# Patient Record
Sex: Male | Born: 1950 | Race: Black or African American | Hispanic: No | Marital: Single | Smoking: Former smoker
Health system: Southern US, Community
[De-identification: ages and names within clinical notes are randomized; demographics above are authoritative.]

## PROBLEM LIST (undated history)

## (undated) DIAGNOSIS — N289 Disorder of kidney and ureter, unspecified: Secondary | ICD-10-CM

## (undated) DIAGNOSIS — E119 Type 2 diabetes mellitus without complications: Secondary | ICD-10-CM

## (undated) DIAGNOSIS — I10 Essential (primary) hypertension: Secondary | ICD-10-CM

## (undated) HISTORY — PX: KIDNEY TRANSPLANT: SHX239

---

## 2015-10-07 DIAGNOSIS — E118 Type 2 diabetes mellitus with unspecified complications: Secondary | ICD-10-CM | POA: Diagnosis not present

## 2015-10-07 DIAGNOSIS — I10 Essential (primary) hypertension: Secondary | ICD-10-CM | POA: Diagnosis not present

## 2015-10-07 DIAGNOSIS — Z113 Encounter for screening for infections with a predominantly sexual mode of transmission: Secondary | ICD-10-CM | POA: Diagnosis not present

## 2015-10-07 DIAGNOSIS — Z5181 Encounter for therapeutic drug level monitoring: Secondary | ICD-10-CM | POA: Diagnosis not present

## 2015-10-07 DIAGNOSIS — Z0000 Encounter for general adult medical examination without abnormal findings: Secondary | ICD-10-CM | POA: Diagnosis not present

## 2015-10-07 DIAGNOSIS — Z131 Encounter for screening for diabetes mellitus: Secondary | ICD-10-CM | POA: Diagnosis not present

## 2015-10-07 DIAGNOSIS — Z136 Encounter for screening for cardiovascular disorders: Secondary | ICD-10-CM | POA: Diagnosis not present

## 2015-10-07 DIAGNOSIS — Z1383 Encounter for screening for respiratory disorder NEC: Secondary | ICD-10-CM | POA: Diagnosis not present

## 2015-10-07 DIAGNOSIS — Z940 Kidney transplant status: Secondary | ICD-10-CM | POA: Diagnosis not present

## 2015-10-07 DIAGNOSIS — E559 Vitamin D deficiency, unspecified: Secondary | ICD-10-CM | POA: Diagnosis not present

## 2015-10-07 DIAGNOSIS — R05 Cough: Secondary | ICD-10-CM | POA: Diagnosis not present

## 2015-10-07 DIAGNOSIS — Z01118 Encounter for examination of ears and hearing with other abnormal findings: Secondary | ICD-10-CM | POA: Diagnosis not present

## 2015-10-07 DIAGNOSIS — E785 Hyperlipidemia, unspecified: Secondary | ICD-10-CM | POA: Diagnosis not present

## 2015-10-24 DIAGNOSIS — Z940 Kidney transplant status: Secondary | ICD-10-CM | POA: Diagnosis not present

## 2015-10-31 DIAGNOSIS — T383X1A Poisoning by insulin and oral hypoglycemic [antidiabetic] drugs, accidental (unintentional), initial encounter: Secondary | ICD-10-CM | POA: Diagnosis not present

## 2015-10-31 DIAGNOSIS — I10 Essential (primary) hypertension: Secondary | ICD-10-CM | POA: Diagnosis not present

## 2015-10-31 DIAGNOSIS — Z794 Long term (current) use of insulin: Secondary | ICD-10-CM | POA: Diagnosis not present

## 2015-10-31 DIAGNOSIS — E119 Type 2 diabetes mellitus without complications: Secondary | ICD-10-CM | POA: Diagnosis not present

## 2015-11-11 DIAGNOSIS — I10 Essential (primary) hypertension: Secondary | ICD-10-CM | POA: Diagnosis not present

## 2015-11-11 DIAGNOSIS — Z940 Kidney transplant status: Secondary | ICD-10-CM | POA: Diagnosis not present

## 2015-11-11 DIAGNOSIS — R55 Syncope and collapse: Secondary | ICD-10-CM | POA: Diagnosis not present

## 2015-11-14 DIAGNOSIS — Z940 Kidney transplant status: Secondary | ICD-10-CM | POA: Diagnosis not present

## 2015-11-16 DIAGNOSIS — H25011 Cortical age-related cataract, right eye: Secondary | ICD-10-CM | POA: Diagnosis not present

## 2015-11-16 DIAGNOSIS — H5213 Myopia, bilateral: Secondary | ICD-10-CM | POA: Diagnosis not present

## 2015-11-16 DIAGNOSIS — H2512 Age-related nuclear cataract, left eye: Secondary | ICD-10-CM | POA: Diagnosis not present

## 2015-11-16 DIAGNOSIS — E119 Type 2 diabetes mellitus without complications: Secondary | ICD-10-CM | POA: Diagnosis not present

## 2015-11-16 DIAGNOSIS — H2511 Age-related nuclear cataract, right eye: Secondary | ICD-10-CM | POA: Diagnosis not present

## 2015-11-16 DIAGNOSIS — H25012 Cortical age-related cataract, left eye: Secondary | ICD-10-CM | POA: Diagnosis not present

## 2015-11-28 DIAGNOSIS — Z940 Kidney transplant status: Secondary | ICD-10-CM | POA: Diagnosis not present

## 2015-11-28 DIAGNOSIS — Z0000 Encounter for general adult medical examination without abnormal findings: Secondary | ICD-10-CM | POA: Diagnosis not present

## 2015-11-28 DIAGNOSIS — E785 Hyperlipidemia, unspecified: Secondary | ICD-10-CM | POA: Diagnosis not present

## 2015-11-28 DIAGNOSIS — E118 Type 2 diabetes mellitus with unspecified complications: Secondary | ICD-10-CM | POA: Diagnosis not present

## 2015-11-28 DIAGNOSIS — Z0100 Encounter for examination of eyes and vision without abnormal findings: Secondary | ICD-10-CM | POA: Diagnosis not present

## 2015-11-28 DIAGNOSIS — N183 Chronic kidney disease, stage 3 (moderate): Secondary | ICD-10-CM | POA: Diagnosis not present

## 2015-11-28 DIAGNOSIS — B1920 Unspecified viral hepatitis C without hepatic coma: Secondary | ICD-10-CM | POA: Diagnosis not present

## 2015-11-28 DIAGNOSIS — E559 Vitamin D deficiency, unspecified: Secondary | ICD-10-CM | POA: Diagnosis not present

## 2015-11-28 DIAGNOSIS — I10 Essential (primary) hypertension: Secondary | ICD-10-CM | POA: Diagnosis not present

## 2016-01-24 DIAGNOSIS — Z940 Kidney transplant status: Secondary | ICD-10-CM | POA: Diagnosis not present

## 2016-01-25 DIAGNOSIS — Z940 Kidney transplant status: Secondary | ICD-10-CM | POA: Diagnosis not present

## 2016-03-03 DIAGNOSIS — R011 Cardiac murmur, unspecified: Secondary | ICD-10-CM | POA: Diagnosis not present

## 2016-03-03 DIAGNOSIS — E139 Other specified diabetes mellitus without complications: Secondary | ICD-10-CM | POA: Diagnosis not present

## 2016-03-03 DIAGNOSIS — I10 Essential (primary) hypertension: Secondary | ICD-10-CM | POA: Diagnosis not present

## 2016-03-07 DIAGNOSIS — I120 Hypertensive chronic kidney disease with stage 5 chronic kidney disease or end stage renal disease: Secondary | ICD-10-CM | POA: Diagnosis not present

## 2016-03-07 DIAGNOSIS — N186 End stage renal disease: Secondary | ICD-10-CM | POA: Diagnosis not present

## 2016-03-07 DIAGNOSIS — M25552 Pain in left hip: Secondary | ICD-10-CM | POA: Diagnosis not present

## 2016-03-07 DIAGNOSIS — E1122 Type 2 diabetes mellitus with diabetic chronic kidney disease: Secondary | ICD-10-CM | POA: Diagnosis not present

## 2016-03-07 DIAGNOSIS — Z794 Long term (current) use of insulin: Secondary | ICD-10-CM | POA: Diagnosis not present

## 2016-03-07 DIAGNOSIS — Z4822 Encounter for aftercare following kidney transplant: Secondary | ICD-10-CM | POA: Diagnosis not present

## 2016-03-07 DIAGNOSIS — D899 Disorder involving the immune mechanism, unspecified: Secondary | ICD-10-CM | POA: Diagnosis not present

## 2016-03-07 DIAGNOSIS — Z940 Kidney transplant status: Secondary | ICD-10-CM | POA: Diagnosis not present

## 2016-03-07 DIAGNOSIS — E785 Hyperlipidemia, unspecified: Secondary | ICD-10-CM | POA: Diagnosis not present

## 2016-03-07 DIAGNOSIS — I10 Essential (primary) hypertension: Secondary | ICD-10-CM | POA: Diagnosis not present

## 2016-03-07 DIAGNOSIS — E119 Type 2 diabetes mellitus without complications: Secondary | ICD-10-CM | POA: Diagnosis not present

## 2016-03-08 DIAGNOSIS — Z4822 Encounter for aftercare following kidney transplant: Secondary | ICD-10-CM | POA: Diagnosis not present

## 2016-03-08 DIAGNOSIS — Z940 Kidney transplant status: Secondary | ICD-10-CM | POA: Diagnosis not present

## 2016-03-19 DIAGNOSIS — B1920 Unspecified viral hepatitis C without hepatic coma: Secondary | ICD-10-CM | POA: Diagnosis not present

## 2016-03-19 DIAGNOSIS — E559 Vitamin D deficiency, unspecified: Secondary | ICD-10-CM | POA: Diagnosis not present

## 2016-03-19 DIAGNOSIS — E118 Type 2 diabetes mellitus with unspecified complications: Secondary | ICD-10-CM | POA: Diagnosis not present

## 2016-03-19 DIAGNOSIS — E785 Hyperlipidemia, unspecified: Secondary | ICD-10-CM | POA: Diagnosis not present

## 2016-03-19 DIAGNOSIS — Z940 Kidney transplant status: Secondary | ICD-10-CM | POA: Diagnosis not present

## 2016-03-19 DIAGNOSIS — N183 Chronic kidney disease, stage 3 (moderate): Secondary | ICD-10-CM | POA: Diagnosis not present

## 2016-03-19 DIAGNOSIS — I10 Essential (primary) hypertension: Secondary | ICD-10-CM | POA: Diagnosis not present

## 2016-04-13 DIAGNOSIS — Z940 Kidney transplant status: Secondary | ICD-10-CM | POA: Diagnosis not present

## 2016-04-16 DIAGNOSIS — Z940 Kidney transplant status: Secondary | ICD-10-CM | POA: Diagnosis not present

## 2016-04-16 DIAGNOSIS — E785 Hyperlipidemia, unspecified: Secondary | ICD-10-CM | POA: Diagnosis not present

## 2016-04-16 DIAGNOSIS — N183 Chronic kidney disease, stage 3 (moderate): Secondary | ICD-10-CM | POA: Diagnosis not present

## 2016-04-16 DIAGNOSIS — B1920 Unspecified viral hepatitis C without hepatic coma: Secondary | ICD-10-CM | POA: Diagnosis not present

## 2016-04-16 DIAGNOSIS — E559 Vitamin D deficiency, unspecified: Secondary | ICD-10-CM | POA: Diagnosis not present

## 2016-04-16 DIAGNOSIS — E118 Type 2 diabetes mellitus with unspecified complications: Secondary | ICD-10-CM | POA: Diagnosis not present

## 2016-04-16 DIAGNOSIS — I10 Essential (primary) hypertension: Secondary | ICD-10-CM | POA: Diagnosis not present

## 2016-04-18 DIAGNOSIS — N390 Urinary tract infection, site not specified: Secondary | ICD-10-CM | POA: Diagnosis not present

## 2016-04-18 DIAGNOSIS — Z940 Kidney transplant status: Secondary | ICD-10-CM | POA: Diagnosis not present

## 2016-04-20 DIAGNOSIS — Z940 Kidney transplant status: Secondary | ICD-10-CM | POA: Diagnosis not present

## 2016-05-14 DIAGNOSIS — Z940 Kidney transplant status: Secondary | ICD-10-CM | POA: Diagnosis not present

## 2016-07-02 DIAGNOSIS — E785 Hyperlipidemia, unspecified: Secondary | ICD-10-CM | POA: Diagnosis not present

## 2016-07-02 DIAGNOSIS — E118 Type 2 diabetes mellitus with unspecified complications: Secondary | ICD-10-CM | POA: Diagnosis not present

## 2016-07-02 DIAGNOSIS — Z940 Kidney transplant status: Secondary | ICD-10-CM | POA: Diagnosis not present

## 2016-07-02 DIAGNOSIS — I10 Essential (primary) hypertension: Secondary | ICD-10-CM | POA: Diagnosis not present

## 2016-07-02 DIAGNOSIS — Z136 Encounter for screening for cardiovascular disorders: Secondary | ICD-10-CM | POA: Diagnosis not present

## 2016-07-02 DIAGNOSIS — Z5181 Encounter for therapeutic drug level monitoring: Secondary | ICD-10-CM | POA: Diagnosis not present

## 2016-07-02 DIAGNOSIS — Z0000 Encounter for general adult medical examination without abnormal findings: Secondary | ICD-10-CM | POA: Diagnosis not present

## 2016-07-02 DIAGNOSIS — E559 Vitamin D deficiency, unspecified: Secondary | ICD-10-CM | POA: Diagnosis not present

## 2016-07-02 DIAGNOSIS — Z01118 Encounter for examination of ears and hearing with other abnormal findings: Secondary | ICD-10-CM | POA: Diagnosis not present

## 2016-07-17 DIAGNOSIS — Z940 Kidney transplant status: Secondary | ICD-10-CM | POA: Diagnosis not present

## 2016-07-17 DIAGNOSIS — N390 Urinary tract infection, site not specified: Secondary | ICD-10-CM | POA: Diagnosis not present

## 2016-10-08 DIAGNOSIS — E785 Hyperlipidemia, unspecified: Secondary | ICD-10-CM | POA: Diagnosis not present

## 2016-10-08 DIAGNOSIS — I10 Essential (primary) hypertension: Secondary | ICD-10-CM | POA: Diagnosis not present

## 2016-10-08 DIAGNOSIS — B1920 Unspecified viral hepatitis C without hepatic coma: Secondary | ICD-10-CM | POA: Diagnosis not present

## 2016-10-08 DIAGNOSIS — Z940 Kidney transplant status: Secondary | ICD-10-CM | POA: Diagnosis not present

## 2016-10-08 DIAGNOSIS — E559 Vitamin D deficiency, unspecified: Secondary | ICD-10-CM | POA: Diagnosis not present

## 2016-10-08 DIAGNOSIS — E118 Type 2 diabetes mellitus with unspecified complications: Secondary | ICD-10-CM | POA: Diagnosis not present

## 2016-10-08 DIAGNOSIS — N183 Chronic kidney disease, stage 3 (moderate): Secondary | ICD-10-CM | POA: Diagnosis not present

## 2016-10-08 DIAGNOSIS — Z0000 Encounter for general adult medical examination without abnormal findings: Secondary | ICD-10-CM | POA: Diagnosis not present

## 2016-10-30 DIAGNOSIS — E559 Vitamin D deficiency, unspecified: Secondary | ICD-10-CM | POA: Diagnosis not present

## 2016-10-30 DIAGNOSIS — Z940 Kidney transplant status: Secondary | ICD-10-CM | POA: Diagnosis not present

## 2016-11-01 DIAGNOSIS — N390 Urinary tract infection, site not specified: Secondary | ICD-10-CM | POA: Diagnosis not present

## 2016-11-01 DIAGNOSIS — Z940 Kidney transplant status: Secondary | ICD-10-CM | POA: Diagnosis not present

## 2016-12-04 DIAGNOSIS — Z940 Kidney transplant status: Secondary | ICD-10-CM | POA: Diagnosis not present

## 2016-12-04 DIAGNOSIS — N390 Urinary tract infection, site not specified: Secondary | ICD-10-CM | POA: Diagnosis not present

## 2016-12-13 DIAGNOSIS — H5213 Myopia, bilateral: Secondary | ICD-10-CM | POA: Diagnosis not present

## 2016-12-13 DIAGNOSIS — E113291 Type 2 diabetes mellitus with mild nonproliferative diabetic retinopathy without macular edema, right eye: Secondary | ICD-10-CM | POA: Diagnosis not present

## 2016-12-13 DIAGNOSIS — Z794 Long term (current) use of insulin: Secondary | ICD-10-CM | POA: Diagnosis not present

## 2016-12-13 DIAGNOSIS — H25013 Cortical age-related cataract, bilateral: Secondary | ICD-10-CM | POA: Diagnosis not present

## 2016-12-13 DIAGNOSIS — H2513 Age-related nuclear cataract, bilateral: Secondary | ICD-10-CM | POA: Diagnosis not present

## 2016-12-13 DIAGNOSIS — H524 Presbyopia: Secondary | ICD-10-CM | POA: Diagnosis not present

## 2017-01-21 DIAGNOSIS — Z940 Kidney transplant status: Secondary | ICD-10-CM | POA: Diagnosis not present

## 2017-01-21 DIAGNOSIS — E559 Vitamin D deficiency, unspecified: Secondary | ICD-10-CM | POA: Diagnosis not present

## 2017-01-21 DIAGNOSIS — N183 Chronic kidney disease, stage 3 (moderate): Secondary | ICD-10-CM | POA: Diagnosis not present

## 2017-01-21 DIAGNOSIS — I10 Essential (primary) hypertension: Secondary | ICD-10-CM | POA: Diagnosis not present

## 2017-01-21 DIAGNOSIS — B1920 Unspecified viral hepatitis C without hepatic coma: Secondary | ICD-10-CM | POA: Diagnosis not present

## 2017-01-21 DIAGNOSIS — E785 Hyperlipidemia, unspecified: Secondary | ICD-10-CM | POA: Diagnosis not present

## 2017-01-21 DIAGNOSIS — E118 Type 2 diabetes mellitus with unspecified complications: Secondary | ICD-10-CM | POA: Diagnosis not present

## 2017-03-01 DIAGNOSIS — Z940 Kidney transplant status: Secondary | ICD-10-CM | POA: Diagnosis not present

## 2017-03-04 DIAGNOSIS — E118 Type 2 diabetes mellitus with unspecified complications: Secondary | ICD-10-CM | POA: Diagnosis not present

## 2017-03-04 DIAGNOSIS — B1920 Unspecified viral hepatitis C without hepatic coma: Secondary | ICD-10-CM | POA: Diagnosis not present

## 2017-03-04 DIAGNOSIS — E785 Hyperlipidemia, unspecified: Secondary | ICD-10-CM | POA: Diagnosis not present

## 2017-03-04 DIAGNOSIS — Z940 Kidney transplant status: Secondary | ICD-10-CM | POA: Diagnosis not present

## 2017-03-04 DIAGNOSIS — E559 Vitamin D deficiency, unspecified: Secondary | ICD-10-CM | POA: Diagnosis not present

## 2017-03-04 DIAGNOSIS — I10 Essential (primary) hypertension: Secondary | ICD-10-CM | POA: Diagnosis not present

## 2017-03-04 DIAGNOSIS — N183 Chronic kidney disease, stage 3 (moderate): Secondary | ICD-10-CM | POA: Diagnosis not present

## 2017-03-06 DIAGNOSIS — N390 Urinary tract infection, site not specified: Secondary | ICD-10-CM | POA: Diagnosis not present

## 2017-03-06 DIAGNOSIS — Z940 Kidney transplant status: Secondary | ICD-10-CM | POA: Diagnosis not present

## 2017-03-07 DIAGNOSIS — E785 Hyperlipidemia, unspecified: Secondary | ICD-10-CM | POA: Diagnosis not present

## 2017-03-07 DIAGNOSIS — I120 Hypertensive chronic kidney disease with stage 5 chronic kidney disease or end stage renal disease: Secondary | ICD-10-CM | POA: Diagnosis not present

## 2017-03-07 DIAGNOSIS — E113291 Type 2 diabetes mellitus with mild nonproliferative diabetic retinopathy without macular edema, right eye: Secondary | ICD-10-CM | POA: Diagnosis not present

## 2017-03-07 DIAGNOSIS — N186 End stage renal disease: Secondary | ICD-10-CM | POA: Diagnosis not present

## 2017-03-07 DIAGNOSIS — E1136 Type 2 diabetes mellitus with diabetic cataract: Secondary | ICD-10-CM | POA: Diagnosis not present

## 2017-03-07 DIAGNOSIS — I10 Essential (primary) hypertension: Secondary | ICD-10-CM | POA: Diagnosis not present

## 2017-03-07 DIAGNOSIS — E119 Type 2 diabetes mellitus without complications: Secondary | ICD-10-CM | POA: Diagnosis not present

## 2017-03-07 DIAGNOSIS — Z940 Kidney transplant status: Secondary | ICD-10-CM | POA: Diagnosis not present

## 2017-03-07 DIAGNOSIS — Z794 Long term (current) use of insulin: Secondary | ICD-10-CM | POA: Diagnosis not present

## 2017-03-07 DIAGNOSIS — Z79899 Other long term (current) drug therapy: Secondary | ICD-10-CM | POA: Diagnosis not present

## 2017-03-07 DIAGNOSIS — Z7982 Long term (current) use of aspirin: Secondary | ICD-10-CM | POA: Diagnosis not present

## 2017-03-07 DIAGNOSIS — E1122 Type 2 diabetes mellitus with diabetic chronic kidney disease: Secondary | ICD-10-CM | POA: Diagnosis not present

## 2017-03-07 DIAGNOSIS — Z4822 Encounter for aftercare following kidney transplant: Secondary | ICD-10-CM | POA: Diagnosis not present

## 2017-03-07 DIAGNOSIS — D8989 Other specified disorders involving the immune mechanism, not elsewhere classified: Secondary | ICD-10-CM | POA: Diagnosis not present

## 2017-03-07 LAB — LIPID PROFILE (EXT)
Chol/HDL Ratio (EXT): 3.1 (ref <4.5)
Cholesterol (EXT): 134 mg/dL (ref 25–199)
HDL Cholesterol (EXT): 43 mg/dL (ref 35–135)
LDL Cholesterol (EXT): 79 mg/dL (ref <130)
NON HDL Cholesterol (EXT): 91 mg/dL
Triglycerides (EXT): 77 mg/dL (ref 10–150)

## 2017-03-07 LAB — UNMAPPED LAB RESULTS: Phosphorous (EXT): 3.7 mg/dL (ref 2.5–4.5)

## 2017-03-15 DIAGNOSIS — Z940 Kidney transplant status: Secondary | ICD-10-CM | POA: Diagnosis not present

## 2017-03-18 DIAGNOSIS — H25013 Cortical age-related cataract, bilateral: Secondary | ICD-10-CM | POA: Diagnosis not present

## 2017-03-18 DIAGNOSIS — Z794 Long term (current) use of insulin: Secondary | ICD-10-CM | POA: Diagnosis not present

## 2017-03-18 DIAGNOSIS — E113291 Type 2 diabetes mellitus with mild nonproliferative diabetic retinopathy without macular edema, right eye: Secondary | ICD-10-CM | POA: Diagnosis not present

## 2017-03-18 DIAGNOSIS — H2513 Age-related nuclear cataract, bilateral: Secondary | ICD-10-CM | POA: Diagnosis not present

## 2017-03-18 DIAGNOSIS — H5213 Myopia, bilateral: Secondary | ICD-10-CM | POA: Diagnosis not present

## 2017-03-18 DIAGNOSIS — H524 Presbyopia: Secondary | ICD-10-CM | POA: Diagnosis not present

## 2017-05-27 DIAGNOSIS — L821 Other seborrheic keratosis: Secondary | ICD-10-CM | POA: Diagnosis not present

## 2017-05-27 DIAGNOSIS — L853 Xerosis cutis: Secondary | ICD-10-CM | POA: Diagnosis not present

## 2017-05-27 DIAGNOSIS — L918 Other hypertrophic disorders of the skin: Secondary | ICD-10-CM | POA: Diagnosis not present

## 2017-05-27 DIAGNOSIS — B353 Tinea pedis: Secondary | ICD-10-CM | POA: Diagnosis not present

## 2017-05-27 DIAGNOSIS — B351 Tinea unguium: Secondary | ICD-10-CM | POA: Diagnosis not present

## 2017-06-03 DIAGNOSIS — N390 Urinary tract infection, site not specified: Secondary | ICD-10-CM | POA: Diagnosis not present

## 2017-06-03 DIAGNOSIS — Z940 Kidney transplant status: Secondary | ICD-10-CM | POA: Diagnosis not present

## 2017-06-07 DIAGNOSIS — Z940 Kidney transplant status: Secondary | ICD-10-CM | POA: Diagnosis not present

## 2017-07-05 DIAGNOSIS — Z125 Encounter for screening for malignant neoplasm of prostate: Secondary | ICD-10-CM | POA: Diagnosis not present

## 2017-07-05 DIAGNOSIS — Z136 Encounter for screening for cardiovascular disorders: Secondary | ICD-10-CM | POA: Diagnosis not present

## 2017-07-05 DIAGNOSIS — Z0000 Encounter for general adult medical examination without abnormal findings: Secondary | ICD-10-CM | POA: Diagnosis not present

## 2017-07-05 DIAGNOSIS — N183 Chronic kidney disease, stage 3 (moderate): Secondary | ICD-10-CM | POA: Diagnosis not present

## 2017-07-05 DIAGNOSIS — E118 Type 2 diabetes mellitus with unspecified complications: Secondary | ICD-10-CM | POA: Diagnosis not present

## 2017-07-05 DIAGNOSIS — Z940 Kidney transplant status: Secondary | ICD-10-CM | POA: Diagnosis not present

## 2017-07-05 DIAGNOSIS — I10 Essential (primary) hypertension: Secondary | ICD-10-CM | POA: Diagnosis not present

## 2017-07-05 DIAGNOSIS — Z01118 Encounter for examination of ears and hearing with other abnormal findings: Secondary | ICD-10-CM | POA: Diagnosis not present

## 2017-07-05 DIAGNOSIS — B1920 Unspecified viral hepatitis C without hepatic coma: Secondary | ICD-10-CM | POA: Diagnosis not present

## 2017-07-05 DIAGNOSIS — E785 Hyperlipidemia, unspecified: Secondary | ICD-10-CM | POA: Diagnosis not present

## 2017-07-05 DIAGNOSIS — E559 Vitamin D deficiency, unspecified: Secondary | ICD-10-CM | POA: Diagnosis not present

## 2017-07-05 DIAGNOSIS — R7303 Prediabetes: Secondary | ICD-10-CM | POA: Diagnosis not present

## 2017-09-16 DIAGNOSIS — Z940 Kidney transplant status: Secondary | ICD-10-CM | POA: Diagnosis not present

## 2017-09-18 DIAGNOSIS — N390 Urinary tract infection, site not specified: Secondary | ICD-10-CM | POA: Diagnosis not present

## 2017-09-18 DIAGNOSIS — Z940 Kidney transplant status: Secondary | ICD-10-CM | POA: Diagnosis not present

## 2017-10-11 DIAGNOSIS — I10 Essential (primary) hypertension: Secondary | ICD-10-CM | POA: Diagnosis not present

## 2017-10-11 DIAGNOSIS — E785 Hyperlipidemia, unspecified: Secondary | ICD-10-CM | POA: Diagnosis not present

## 2017-10-11 DIAGNOSIS — E118 Type 2 diabetes mellitus with unspecified complications: Secondary | ICD-10-CM | POA: Diagnosis not present

## 2017-10-11 DIAGNOSIS — Z0000 Encounter for general adult medical examination without abnormal findings: Secondary | ICD-10-CM | POA: Diagnosis not present

## 2017-10-11 DIAGNOSIS — E559 Vitamin D deficiency, unspecified: Secondary | ICD-10-CM | POA: Diagnosis not present

## 2017-11-20 DIAGNOSIS — M25512 Pain in left shoulder: Secondary | ICD-10-CM | POA: Diagnosis not present

## 2017-11-20 DIAGNOSIS — S42255A Nondisplaced fracture of greater tuberosity of left humerus, initial encounter for closed fracture: Secondary | ICD-10-CM | POA: Diagnosis not present

## 2017-11-20 DIAGNOSIS — W0110XA Fall on same level from slipping, tripping and stumbling with subsequent striking against unspecified object, initial encounter: Secondary | ICD-10-CM | POA: Diagnosis not present

## 2017-11-20 DIAGNOSIS — Y999 Unspecified external cause status: Secondary | ICD-10-CM | POA: Diagnosis not present

## 2017-11-20 DIAGNOSIS — M79622 Pain in left upper arm: Secondary | ICD-10-CM | POA: Diagnosis not present

## 2017-11-21 DIAGNOSIS — S42255A Nondisplaced fracture of greater tuberosity of left humerus, initial encounter for closed fracture: Secondary | ICD-10-CM | POA: Diagnosis not present

## 2017-11-26 DIAGNOSIS — Y999 Unspecified external cause status: Secondary | ICD-10-CM | POA: Diagnosis not present

## 2017-11-26 DIAGNOSIS — Z794 Long term (current) use of insulin: Secondary | ICD-10-CM | POA: Diagnosis not present

## 2017-11-26 DIAGNOSIS — S99822A Other specified injuries of left foot, initial encounter: Secondary | ICD-10-CM | POA: Diagnosis not present

## 2017-11-26 DIAGNOSIS — X58XXXA Exposure to other specified factors, initial encounter: Secondary | ICD-10-CM | POA: Diagnosis not present

## 2017-11-26 DIAGNOSIS — S91205A Unspecified open wound of left lesser toe(s) with damage to nail, initial encounter: Secondary | ICD-10-CM | POA: Diagnosis not present

## 2017-11-26 DIAGNOSIS — E119 Type 2 diabetes mellitus without complications: Secondary | ICD-10-CM | POA: Diagnosis not present

## 2017-12-05 DIAGNOSIS — S42255D Nondisplaced fracture of greater tuberosity of left humerus, subsequent encounter for fracture with routine healing: Secondary | ICD-10-CM | POA: Diagnosis not present

## 2017-12-06 DIAGNOSIS — I10 Essential (primary) hypertension: Secondary | ICD-10-CM | POA: Diagnosis not present

## 2017-12-06 DIAGNOSIS — E559 Vitamin D deficiency, unspecified: Secondary | ICD-10-CM | POA: Diagnosis not present

## 2017-12-06 DIAGNOSIS — E118 Type 2 diabetes mellitus with unspecified complications: Secondary | ICD-10-CM | POA: Diagnosis not present

## 2017-12-06 DIAGNOSIS — E785 Hyperlipidemia, unspecified: Secondary | ICD-10-CM | POA: Diagnosis not present

## 2017-12-10 DIAGNOSIS — E785 Hyperlipidemia, unspecified: Secondary | ICD-10-CM | POA: Diagnosis not present

## 2017-12-10 DIAGNOSIS — I10 Essential (primary) hypertension: Secondary | ICD-10-CM | POA: Diagnosis not present

## 2017-12-10 DIAGNOSIS — E1165 Type 2 diabetes mellitus with hyperglycemia: Secondary | ICD-10-CM | POA: Diagnosis not present

## 2017-12-10 DIAGNOSIS — E663 Overweight: Secondary | ICD-10-CM | POA: Diagnosis not present

## 2017-12-13 DIAGNOSIS — S42302A Unspecified fracture of shaft of humerus, left arm, initial encounter for closed fracture: Secondary | ICD-10-CM | POA: Diagnosis not present

## 2017-12-13 DIAGNOSIS — M25512 Pain in left shoulder: Secondary | ICD-10-CM | POA: Diagnosis not present

## 2017-12-13 DIAGNOSIS — R293 Abnormal posture: Secondary | ICD-10-CM | POA: Diagnosis not present

## 2017-12-16 DIAGNOSIS — E113293 Type 2 diabetes mellitus with mild nonproliferative diabetic retinopathy without macular edema, bilateral: Secondary | ICD-10-CM | POA: Diagnosis not present

## 2017-12-16 DIAGNOSIS — H5213 Myopia, bilateral: Secondary | ICD-10-CM | POA: Diagnosis not present

## 2017-12-16 DIAGNOSIS — H2513 Age-related nuclear cataract, bilateral: Secondary | ICD-10-CM | POA: Diagnosis not present

## 2017-12-16 DIAGNOSIS — H25013 Cortical age-related cataract, bilateral: Secondary | ICD-10-CM | POA: Diagnosis not present

## 2017-12-16 DIAGNOSIS — H524 Presbyopia: Secondary | ICD-10-CM | POA: Diagnosis not present

## 2017-12-16 DIAGNOSIS — Z794 Long term (current) use of insulin: Secondary | ICD-10-CM | POA: Diagnosis not present

## 2017-12-18 DIAGNOSIS — R293 Abnormal posture: Secondary | ICD-10-CM | POA: Diagnosis not present

## 2017-12-18 DIAGNOSIS — S42302A Unspecified fracture of shaft of humerus, left arm, initial encounter for closed fracture: Secondary | ICD-10-CM | POA: Diagnosis not present

## 2017-12-18 DIAGNOSIS — M25512 Pain in left shoulder: Secondary | ICD-10-CM | POA: Diagnosis not present

## 2017-12-20 DIAGNOSIS — E118 Type 2 diabetes mellitus with unspecified complications: Secondary | ICD-10-CM | POA: Diagnosis not present

## 2017-12-20 DIAGNOSIS — R293 Abnormal posture: Secondary | ICD-10-CM | POA: Diagnosis not present

## 2017-12-20 DIAGNOSIS — E559 Vitamin D deficiency, unspecified: Secondary | ICD-10-CM | POA: Diagnosis not present

## 2017-12-20 DIAGNOSIS — S42302A Unspecified fracture of shaft of humerus, left arm, initial encounter for closed fracture: Secondary | ICD-10-CM | POA: Diagnosis not present

## 2017-12-20 DIAGNOSIS — M25512 Pain in left shoulder: Secondary | ICD-10-CM | POA: Diagnosis not present

## 2017-12-20 DIAGNOSIS — I10 Essential (primary) hypertension: Secondary | ICD-10-CM | POA: Diagnosis not present

## 2017-12-20 DIAGNOSIS — E785 Hyperlipidemia, unspecified: Secondary | ICD-10-CM | POA: Diagnosis not present

## 2017-12-25 DIAGNOSIS — S42302A Unspecified fracture of shaft of humerus, left arm, initial encounter for closed fracture: Secondary | ICD-10-CM | POA: Diagnosis not present

## 2017-12-25 DIAGNOSIS — M25512 Pain in left shoulder: Secondary | ICD-10-CM | POA: Diagnosis not present

## 2017-12-25 DIAGNOSIS — R293 Abnormal posture: Secondary | ICD-10-CM | POA: Diagnosis not present

## 2017-12-27 DIAGNOSIS — M25512 Pain in left shoulder: Secondary | ICD-10-CM | POA: Diagnosis not present

## 2017-12-27 DIAGNOSIS — S42302A Unspecified fracture of shaft of humerus, left arm, initial encounter for closed fracture: Secondary | ICD-10-CM | POA: Diagnosis not present

## 2017-12-27 DIAGNOSIS — R293 Abnormal posture: Secondary | ICD-10-CM | POA: Diagnosis not present

## 2018-01-01 DIAGNOSIS — M25512 Pain in left shoulder: Secondary | ICD-10-CM | POA: Diagnosis not present

## 2018-01-01 DIAGNOSIS — S42302A Unspecified fracture of shaft of humerus, left arm, initial encounter for closed fracture: Secondary | ICD-10-CM | POA: Diagnosis not present

## 2018-01-01 DIAGNOSIS — R293 Abnormal posture: Secondary | ICD-10-CM | POA: Diagnosis not present

## 2018-01-02 DIAGNOSIS — S42255D Nondisplaced fracture of greater tuberosity of left humerus, subsequent encounter for fracture with routine healing: Secondary | ICD-10-CM | POA: Diagnosis not present

## 2018-01-03 DIAGNOSIS — R293 Abnormal posture: Secondary | ICD-10-CM | POA: Diagnosis not present

## 2018-01-03 DIAGNOSIS — S42302A Unspecified fracture of shaft of humerus, left arm, initial encounter for closed fracture: Secondary | ICD-10-CM | POA: Diagnosis not present

## 2018-01-03 DIAGNOSIS — M25512 Pain in left shoulder: Secondary | ICD-10-CM | POA: Diagnosis not present

## 2018-01-10 DIAGNOSIS — S42302A Unspecified fracture of shaft of humerus, left arm, initial encounter for closed fracture: Secondary | ICD-10-CM | POA: Diagnosis not present

## 2018-01-10 DIAGNOSIS — R293 Abnormal posture: Secondary | ICD-10-CM | POA: Diagnosis not present

## 2018-01-10 DIAGNOSIS — M25512 Pain in left shoulder: Secondary | ICD-10-CM | POA: Diagnosis not present

## 2018-01-24 DIAGNOSIS — R293 Abnormal posture: Secondary | ICD-10-CM | POA: Diagnosis not present

## 2018-01-24 DIAGNOSIS — M25512 Pain in left shoulder: Secondary | ICD-10-CM | POA: Diagnosis not present

## 2018-01-24 DIAGNOSIS — S42302A Unspecified fracture of shaft of humerus, left arm, initial encounter for closed fracture: Secondary | ICD-10-CM | POA: Diagnosis not present

## 2018-01-31 DIAGNOSIS — R293 Abnormal posture: Secondary | ICD-10-CM | POA: Diagnosis not present

## 2018-01-31 DIAGNOSIS — S42302A Unspecified fracture of shaft of humerus, left arm, initial encounter for closed fracture: Secondary | ICD-10-CM | POA: Diagnosis not present

## 2018-01-31 DIAGNOSIS — M25512 Pain in left shoulder: Secondary | ICD-10-CM | POA: Diagnosis not present

## 2018-02-21 DIAGNOSIS — M25512 Pain in left shoulder: Secondary | ICD-10-CM | POA: Diagnosis not present

## 2018-02-21 DIAGNOSIS — S42302A Unspecified fracture of shaft of humerus, left arm, initial encounter for closed fracture: Secondary | ICD-10-CM | POA: Diagnosis not present

## 2018-02-21 DIAGNOSIS — R293 Abnormal posture: Secondary | ICD-10-CM | POA: Diagnosis not present

## 2018-02-28 DIAGNOSIS — M25512 Pain in left shoulder: Secondary | ICD-10-CM | POA: Diagnosis not present

## 2018-02-28 DIAGNOSIS — S42302A Unspecified fracture of shaft of humerus, left arm, initial encounter for closed fracture: Secondary | ICD-10-CM | POA: Diagnosis not present

## 2018-02-28 DIAGNOSIS — R293 Abnormal posture: Secondary | ICD-10-CM | POA: Diagnosis not present

## 2018-03-05 DIAGNOSIS — M25512 Pain in left shoulder: Secondary | ICD-10-CM | POA: Diagnosis not present

## 2018-03-05 DIAGNOSIS — R293 Abnormal posture: Secondary | ICD-10-CM | POA: Diagnosis not present

## 2018-03-05 DIAGNOSIS — S42302A Unspecified fracture of shaft of humerus, left arm, initial encounter for closed fracture: Secondary | ICD-10-CM | POA: Diagnosis not present

## 2018-03-07 DIAGNOSIS — K648 Other hemorrhoids: Secondary | ICD-10-CM | POA: Diagnosis not present

## 2018-03-07 DIAGNOSIS — K5730 Diverticulosis of large intestine without perforation or abscess without bleeding: Secondary | ICD-10-CM | POA: Diagnosis not present

## 2018-03-07 DIAGNOSIS — Z1211 Encounter for screening for malignant neoplasm of colon: Secondary | ICD-10-CM | POA: Diagnosis not present

## 2018-03-07 DIAGNOSIS — Z86010 Personal history of colonic polyps: Secondary | ICD-10-CM | POA: Diagnosis not present

## 2018-03-19 DIAGNOSIS — I10 Essential (primary) hypertension: Secondary | ICD-10-CM | POA: Diagnosis not present

## 2018-03-19 DIAGNOSIS — E118 Type 2 diabetes mellitus with unspecified complications: Secondary | ICD-10-CM | POA: Diagnosis not present

## 2018-03-19 DIAGNOSIS — E785 Hyperlipidemia, unspecified: Secondary | ICD-10-CM | POA: Diagnosis not present

## 2018-03-19 DIAGNOSIS — E559 Vitamin D deficiency, unspecified: Secondary | ICD-10-CM | POA: Diagnosis not present

## 2018-03-19 DIAGNOSIS — Z2821 Immunization not carried out because of patient refusal: Secondary | ICD-10-CM | POA: Diagnosis not present

## 2018-03-20 DIAGNOSIS — N390 Urinary tract infection, site not specified: Secondary | ICD-10-CM | POA: Diagnosis not present

## 2018-03-20 DIAGNOSIS — Z940 Kidney transplant status: Secondary | ICD-10-CM | POA: Diagnosis not present

## 2018-05-28 DIAGNOSIS — B351 Tinea unguium: Secondary | ICD-10-CM | POA: Diagnosis not present

## 2018-05-28 DIAGNOSIS — L918 Other hypertrophic disorders of the skin: Secondary | ICD-10-CM | POA: Diagnosis not present

## 2018-05-28 DIAGNOSIS — D229 Melanocytic nevi, unspecified: Secondary | ICD-10-CM | POA: Diagnosis not present

## 2018-05-28 DIAGNOSIS — L821 Other seborrheic keratosis: Secondary | ICD-10-CM | POA: Diagnosis not present

## 2018-06-10 DIAGNOSIS — I10 Essential (primary) hypertension: Secondary | ICD-10-CM | POA: Diagnosis not present

## 2018-06-10 DIAGNOSIS — Z79899 Other long term (current) drug therapy: Secondary | ICD-10-CM | POA: Diagnosis not present

## 2018-06-10 DIAGNOSIS — D899 Disorder involving the immune mechanism, unspecified: Secondary | ICD-10-CM | POA: Diagnosis not present

## 2018-06-10 DIAGNOSIS — Z4822 Encounter for aftercare following kidney transplant: Secondary | ICD-10-CM | POA: Diagnosis not present

## 2018-06-10 DIAGNOSIS — E113291 Type 2 diabetes mellitus with mild nonproliferative diabetic retinopathy without macular edema, right eye: Secondary | ICD-10-CM | POA: Diagnosis not present

## 2018-06-10 DIAGNOSIS — Z7952 Long term (current) use of systemic steroids: Secondary | ICD-10-CM | POA: Diagnosis not present

## 2018-06-10 DIAGNOSIS — Z87891 Personal history of nicotine dependence: Secondary | ICD-10-CM | POA: Diagnosis not present

## 2018-06-10 DIAGNOSIS — Z940 Kidney transplant status: Secondary | ICD-10-CM | POA: Diagnosis not present

## 2018-06-10 DIAGNOSIS — E785 Hyperlipidemia, unspecified: Secondary | ICD-10-CM | POA: Diagnosis not present

## 2018-06-10 DIAGNOSIS — E119 Type 2 diabetes mellitus without complications: Secondary | ICD-10-CM | POA: Diagnosis not present

## 2018-06-10 DIAGNOSIS — Z794 Long term (current) use of insulin: Secondary | ICD-10-CM | POA: Diagnosis not present

## 2018-06-10 LAB — LIPID PROFILE (EXT)
Chol/HDL Ratio (EXT): 3.4 (ref <4.5)
Cholesterol (EXT): 128 mg/dL (ref 25–199)
HDL Cholesterol (EXT): 38 mg/dL (ref 35–135)
LDL Cholesterol (EXT): 80 mg/dL (ref <130)
NON HDL Cholesterol (EXT): 90 mg/dL
Triglycerides (EXT): 59 mg/dL (ref 10–150)

## 2018-06-10 LAB — UNMAPPED LAB RESULTS: Phosphorous (EXT): 3.5 mg/dL (ref 2.5–4.5)

## 2018-07-04 DIAGNOSIS — Z4822 Encounter for aftercare following kidney transplant: Secondary | ICD-10-CM | POA: Diagnosis not present

## 2018-07-08 DIAGNOSIS — E785 Hyperlipidemia, unspecified: Secondary | ICD-10-CM | POA: Diagnosis not present

## 2018-07-08 DIAGNOSIS — E559 Vitamin D deficiency, unspecified: Secondary | ICD-10-CM | POA: Diagnosis not present

## 2018-07-08 DIAGNOSIS — E118 Type 2 diabetes mellitus with unspecified complications: Secondary | ICD-10-CM | POA: Diagnosis not present

## 2018-07-08 DIAGNOSIS — Z0110 Encounter for examination of ears and hearing without abnormal findings: Secondary | ICD-10-CM | POA: Diagnosis not present

## 2018-07-08 DIAGNOSIS — Z125 Encounter for screening for malignant neoplasm of prostate: Secondary | ICD-10-CM | POA: Diagnosis not present

## 2018-07-08 DIAGNOSIS — Z0000 Encounter for general adult medical examination without abnormal findings: Secondary | ICD-10-CM | POA: Diagnosis not present

## 2018-07-08 DIAGNOSIS — I10 Essential (primary) hypertension: Secondary | ICD-10-CM | POA: Diagnosis not present

## 2018-08-26 DIAGNOSIS — S42255D Nondisplaced fracture of greater tuberosity of left humerus, subsequent encounter for fracture with routine healing: Secondary | ICD-10-CM | POA: Diagnosis not present

## 2018-08-26 DIAGNOSIS — M19012 Primary osteoarthritis, left shoulder: Secondary | ICD-10-CM | POA: Diagnosis not present

## 2018-08-27 DIAGNOSIS — E785 Hyperlipidemia, unspecified: Secondary | ICD-10-CM | POA: Diagnosis not present

## 2018-08-27 DIAGNOSIS — I10 Essential (primary) hypertension: Secondary | ICD-10-CM | POA: Diagnosis not present

## 2018-08-27 DIAGNOSIS — E559 Vitamin D deficiency, unspecified: Secondary | ICD-10-CM | POA: Diagnosis not present

## 2018-08-27 DIAGNOSIS — E118 Type 2 diabetes mellitus with unspecified complications: Secondary | ICD-10-CM | POA: Diagnosis not present

## 2018-09-01 DIAGNOSIS — M25512 Pain in left shoulder: Secondary | ICD-10-CM | POA: Diagnosis not present

## 2018-09-01 DIAGNOSIS — M7502 Adhesive capsulitis of left shoulder: Secondary | ICD-10-CM | POA: Diagnosis not present

## 2018-09-15 DIAGNOSIS — M25512 Pain in left shoulder: Secondary | ICD-10-CM | POA: Diagnosis not present

## 2018-09-15 DIAGNOSIS — M7502 Adhesive capsulitis of left shoulder: Secondary | ICD-10-CM | POA: Diagnosis not present

## 2018-09-17 DIAGNOSIS — Z940 Kidney transplant status: Secondary | ICD-10-CM | POA: Diagnosis not present

## 2018-09-18 DIAGNOSIS — N390 Urinary tract infection, site not specified: Secondary | ICD-10-CM | POA: Diagnosis not present

## 2018-09-18 DIAGNOSIS — Z940 Kidney transplant status: Secondary | ICD-10-CM | POA: Diagnosis not present

## 2018-09-24 DIAGNOSIS — E118 Type 2 diabetes mellitus with unspecified complications: Secondary | ICD-10-CM | POA: Diagnosis not present

## 2018-09-24 DIAGNOSIS — Z87891 Personal history of nicotine dependence: Secondary | ICD-10-CM | POA: Diagnosis not present

## 2018-09-24 DIAGNOSIS — I10 Essential (primary) hypertension: Secondary | ICD-10-CM | POA: Diagnosis not present

## 2018-09-24 DIAGNOSIS — E559 Vitamin D deficiency, unspecified: Secondary | ICD-10-CM | POA: Diagnosis not present

## 2018-09-24 DIAGNOSIS — E782 Mixed hyperlipidemia: Secondary | ICD-10-CM | POA: Diagnosis not present

## 2018-10-15 DIAGNOSIS — I10 Essential (primary) hypertension: Secondary | ICD-10-CM | POA: Diagnosis not present

## 2018-10-15 DIAGNOSIS — E782 Mixed hyperlipidemia: Secondary | ICD-10-CM | POA: Diagnosis not present

## 2018-10-15 DIAGNOSIS — E559 Vitamin D deficiency, unspecified: Secondary | ICD-10-CM | POA: Diagnosis not present

## 2018-10-15 DIAGNOSIS — M545 Low back pain: Secondary | ICD-10-CM | POA: Diagnosis not present

## 2018-10-15 DIAGNOSIS — Z87891 Personal history of nicotine dependence: Secondary | ICD-10-CM | POA: Diagnosis not present

## 2018-10-15 DIAGNOSIS — E1165 Type 2 diabetes mellitus with hyperglycemia: Secondary | ICD-10-CM | POA: Diagnosis not present

## 2018-10-15 DIAGNOSIS — Z0001 Encounter for general adult medical examination with abnormal findings: Secondary | ICD-10-CM | POA: Diagnosis not present

## 2018-10-20 DIAGNOSIS — M25512 Pain in left shoulder: Secondary | ICD-10-CM | POA: Diagnosis not present

## 2018-10-20 DIAGNOSIS — M7502 Adhesive capsulitis of left shoulder: Secondary | ICD-10-CM | POA: Diagnosis not present

## 2018-11-03 DIAGNOSIS — M7502 Adhesive capsulitis of left shoulder: Secondary | ICD-10-CM | POA: Diagnosis not present

## 2018-11-03 DIAGNOSIS — M25512 Pain in left shoulder: Secondary | ICD-10-CM | POA: Diagnosis not present

## 2018-11-19 DIAGNOSIS — M7502 Adhesive capsulitis of left shoulder: Secondary | ICD-10-CM | POA: Diagnosis not present

## 2018-11-19 DIAGNOSIS — M25512 Pain in left shoulder: Secondary | ICD-10-CM | POA: Diagnosis not present

## 2018-11-20 DIAGNOSIS — E118 Type 2 diabetes mellitus with unspecified complications: Secondary | ICD-10-CM | POA: Diagnosis not present

## 2018-11-20 DIAGNOSIS — E113291 Type 2 diabetes mellitus with mild nonproliferative diabetic retinopathy without macular edema, right eye: Secondary | ICD-10-CM | POA: Diagnosis not present

## 2018-11-20 DIAGNOSIS — I10 Essential (primary) hypertension: Secondary | ICD-10-CM | POA: Diagnosis not present

## 2018-11-20 DIAGNOSIS — N183 Chronic kidney disease, stage 3 (moderate): Secondary | ICD-10-CM | POA: Diagnosis not present

## 2019-03-23 DIAGNOSIS — E118 Type 2 diabetes mellitus with unspecified complications: Secondary | ICD-10-CM | POA: Diagnosis not present

## 2019-03-23 DIAGNOSIS — N183 Chronic kidney disease, stage 3 unspecified: Secondary | ICD-10-CM | POA: Diagnosis not present

## 2019-03-23 DIAGNOSIS — I10 Essential (primary) hypertension: Secondary | ICD-10-CM | POA: Diagnosis not present

## 2019-03-23 DIAGNOSIS — E113291 Type 2 diabetes mellitus with mild nonproliferative diabetic retinopathy without macular edema, right eye: Secondary | ICD-10-CM | POA: Diagnosis not present

## 2019-04-10 ENCOUNTER — Emergency Department (HOSPITAL_BASED_OUTPATIENT_CLINIC_OR_DEPARTMENT_OTHER)
Admission: EM | Admit: 2019-04-10 | Discharge: 2019-04-10 | Disposition: A | Discharge status: Home / Self Care | Payer: Medicare HMO | Attending: Emergency Medicine | Admitting: Emergency Medicine

## 2019-04-10 ENCOUNTER — Emergency Department (HOSPITAL_BASED_OUTPATIENT_CLINIC_OR_DEPARTMENT_OTHER): Payer: Medicare HMO

## 2019-04-10 ENCOUNTER — Other Ambulatory Visit: Payer: Self-pay

## 2019-04-10 ENCOUNTER — Encounter (HOSPITAL_BASED_OUTPATIENT_CLINIC_OR_DEPARTMENT_OTHER): Payer: Self-pay | Admitting: *Deleted

## 2019-04-10 DIAGNOSIS — E782 Mixed hyperlipidemia: Secondary | ICD-10-CM | POA: Diagnosis not present

## 2019-04-10 DIAGNOSIS — I951 Orthostatic hypotension: Secondary | ICD-10-CM | POA: Diagnosis not present

## 2019-04-10 DIAGNOSIS — E869 Volume depletion, unspecified: Secondary | ICD-10-CM | POA: Diagnosis not present

## 2019-04-10 DIAGNOSIS — Z794 Long term (current) use of insulin: Secondary | ICD-10-CM | POA: Diagnosis not present

## 2019-04-10 DIAGNOSIS — R05 Cough: Secondary | ICD-10-CM | POA: Diagnosis not present

## 2019-04-10 DIAGNOSIS — E559 Vitamin D deficiency, unspecified: Secondary | ICD-10-CM | POA: Diagnosis not present

## 2019-04-10 DIAGNOSIS — E860 Dehydration: Secondary | ICD-10-CM | POA: Diagnosis not present

## 2019-04-10 DIAGNOSIS — Z7982 Long term (current) use of aspirin: Secondary | ICD-10-CM | POA: Insufficient documentation

## 2019-04-10 DIAGNOSIS — R9431 Abnormal electrocardiogram [ECG] [EKG]: Secondary | ICD-10-CM | POA: Diagnosis not present

## 2019-04-10 DIAGNOSIS — Z79899 Other long term (current) drug therapy: Secondary | ICD-10-CM | POA: Insufficient documentation

## 2019-04-10 DIAGNOSIS — E1165 Type 2 diabetes mellitus with hyperglycemia: Secondary | ICD-10-CM | POA: Diagnosis not present

## 2019-04-10 DIAGNOSIS — Z940 Kidney transplant status: Secondary | ICD-10-CM | POA: Insufficient documentation

## 2019-04-10 DIAGNOSIS — U071 COVID-19: Secondary | ICD-10-CM | POA: Insufficient documentation

## 2019-04-10 DIAGNOSIS — E119 Type 2 diabetes mellitus without complications: Secondary | ICD-10-CM | POA: Diagnosis not present

## 2019-04-10 DIAGNOSIS — R197 Diarrhea, unspecified: Secondary | ICD-10-CM

## 2019-04-10 DIAGNOSIS — Z87891 Personal history of nicotine dependence: Secondary | ICD-10-CM | POA: Insufficient documentation

## 2019-04-10 DIAGNOSIS — I10 Essential (primary) hypertension: Secondary | ICD-10-CM | POA: Diagnosis not present

## 2019-04-10 HISTORY — DX: Type 2 diabetes mellitus without complications: E11.9

## 2019-04-10 HISTORY — DX: Disorder of kidney and ureter, unspecified: N28.9

## 2019-04-10 HISTORY — DX: Essential (primary) hypertension: I10

## 2019-04-10 LAB — CBC WITH DIFFERENTIAL/PLATELET
Abs Immature Granulocytes: 0.02 10*3/uL (ref 0.00–0.07)
Basophils Absolute: 0 10*3/uL (ref 0.0–0.1)
Basophils Relative: 0 %
Eosinophils Absolute: 0 10*3/uL (ref 0.0–0.5)
Eosinophils Relative: 0 %
HCT: 45.1 % (ref 39.0–52.0)
Hemoglobin: 14.1 g/dL (ref 13.0–17.0)
Immature Granulocytes: 0 %
Lymphocytes Relative: 8 %
Lymphs Abs: 0.4 10*3/uL — ABNORMAL LOW (ref 0.7–4.0)
MCH: 26.1 pg (ref 26.0–34.0)
MCHC: 31.3 g/dL (ref 30.0–36.0)
MCV: 83.4 fL (ref 80.0–100.0)
Monocytes Absolute: 0.5 10*3/uL (ref 0.1–1.0)
Monocytes Relative: 10 %
Neutro Abs: 4.3 10*3/uL (ref 1.7–7.7)
Neutrophils Relative %: 82 %
Platelets: 194 10*3/uL (ref 150–400)
RBC: 5.41 MIL/uL (ref 4.22–5.81)
RDW: 12.7 % (ref 11.5–15.5)
WBC: 5.2 10*3/uL (ref 4.0–10.5)
nRBC: 0 % (ref 0.0–0.2)

## 2019-04-10 LAB — URINALYSIS, MICROSCOPIC (REFLEX)
RBC / HPF: NONE SEEN RBC/hpf (ref 0–5)
WBC, UA: NONE SEEN WBC/hpf (ref 0–5)

## 2019-04-10 LAB — COMPREHENSIVE METABOLIC PANEL
ALT: 19 U/L (ref 0–44)
AST: 23 U/L (ref 15–41)
Albumin: 4.2 g/dL (ref 3.5–5.0)
Alkaline Phosphatase: 60 U/L (ref 38–126)
Anion gap: 10 (ref 5–15)
BUN: 22 mg/dL (ref 8–23)
CO2: 22 mmol/L (ref 22–32)
Calcium: 9.2 mg/dL (ref 8.9–10.3)
Chloride: 102 mmol/L (ref 98–111)
Creatinine, Ser: 1.9 mg/dL — ABNORMAL HIGH (ref 0.61–1.24)
GFR calc Af Amer: 41 mL/min — ABNORMAL LOW (ref >60)
GFR calc non Af Amer: 36 mL/min — ABNORMAL LOW (ref >60)
Glucose, Bld: 192 mg/dL — ABNORMAL HIGH (ref 70–99)
Potassium: 5.1 mmol/L (ref 3.5–5.1)
Sodium: 134 mmol/L — ABNORMAL LOW (ref 135–145)
Total Bilirubin: 0.7 mg/dL (ref 0.3–1.2)
Total Protein: 7.8 g/dL (ref 6.5–8.1)

## 2019-04-10 LAB — URINALYSIS, ROUTINE W REFLEX MICROSCOPIC
Bilirubin Urine: NEGATIVE
Glucose, UA: NEGATIVE mg/dL
Ketones, ur: NEGATIVE mg/dL
Leukocytes,Ua: NEGATIVE
Nitrite: NEGATIVE
Protein, ur: 100 mg/dL — AB
Specific Gravity, Urine: 1.015 (ref 1.005–1.030)
pH: 6.5 (ref 5.0–8.0)

## 2019-04-10 LAB — LACTIC ACID, PLASMA: Lactic Acid, Venous: 1.3 mmol/L (ref 0.5–1.9)

## 2019-04-10 LAB — LACTATE DEHYDROGENASE: LDH: 154 U/L (ref 98–192)

## 2019-04-10 LAB — LIPASE, BLOOD: Lipase: 34 U/L (ref 11–51)

## 2019-04-10 LAB — D-DIMER, QUANTITATIVE: D-Dimer, Quant: 0.55 ug/mL-FEU — ABNORMAL HIGH (ref 0.00–0.50)

## 2019-04-10 LAB — SARS CORONAVIRUS 2 AG (30 MIN TAT): SARS Coronavirus 2 Ag: POSITIVE — AB

## 2019-04-10 LAB — FIBRINOGEN: Fibrinogen: 608 mg/dL — ABNORMAL HIGH (ref 210–475)

## 2019-04-10 LAB — C-REACTIVE PROTEIN: CRP: 6.9 mg/dL — ABNORMAL HIGH (ref <1.0)

## 2019-04-10 LAB — FERRITIN: Ferritin: 451 ng/mL — ABNORMAL HIGH (ref 24–336)

## 2019-04-10 LAB — TRIGLYCERIDES: Triglycerides: 91 mg/dL (ref <150)

## 2019-04-10 LAB — PROCALCITONIN: Procalcitonin: <0.1 ng/mL

## 2019-04-10 IMAGING — DX DG CHEST 1V PORT
1 series · 1 of 1 positions shown · non-contrast
Comparison: None.

CLINICAL DATA: Cough, fatigue, runny nose and diarrhea for 1 week.

EXAM:
PORTABLE CHEST 1 VIEW

[chest ap]
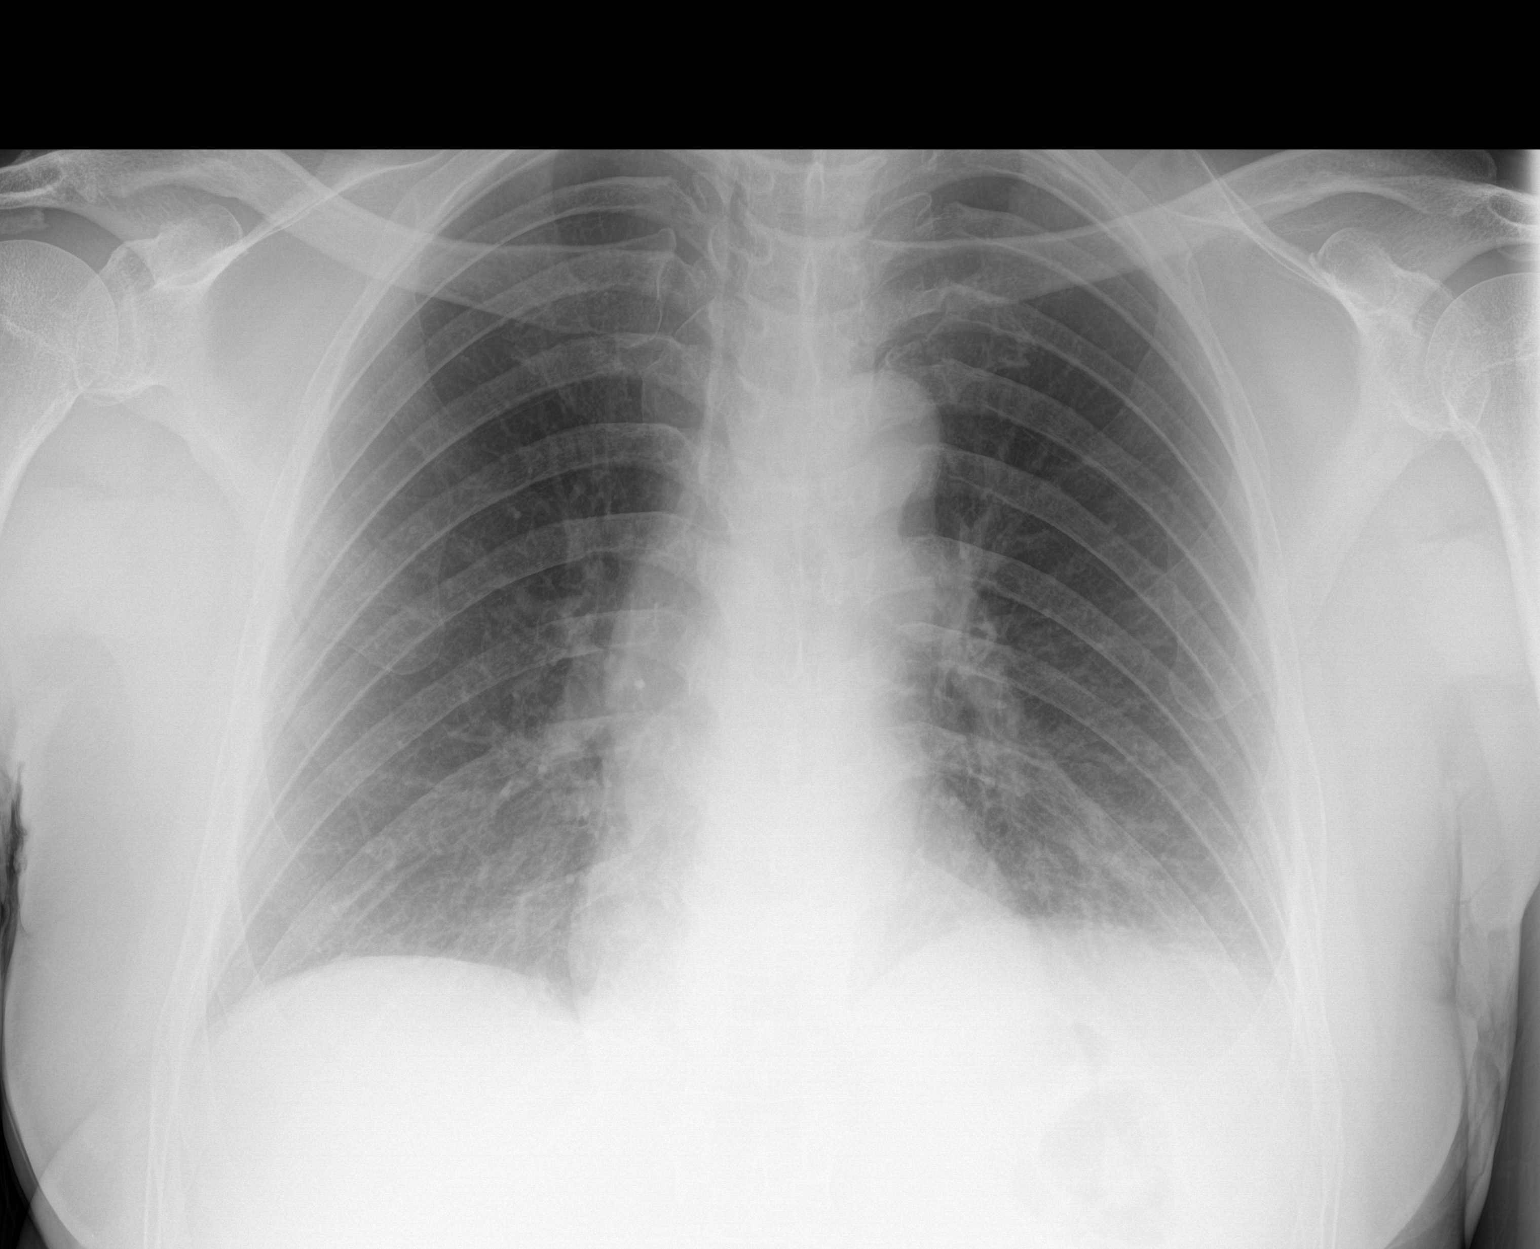

[1 of 1 positions shown; findings below may reference images not displayed]

FINDINGS: Cardiac silhouette is normal in size. No mediastinal or hilar
masses. No evidence of adenopathy.

Prominent bronchovascular markings at the lung bases. Mild
additional hazy opacity noted at the left lung base may be due to
infection or atelectasis. Remainder of the lungs is clear. No
convincing pleural effusion and no pneumothorax.

Skeletal structures are grossly intact.
IMPRESSION: 1. Mild opacity at the left lung base which may be due to
atelectasis or infection. No other evidence of acute cardiopulmonary
disease.

## 2019-04-10 MED ORDER — SODIUM CHLORIDE 0.9 % IV BOLUS: 500.0000 mL | Freq: Once | INTRAVENOUS | Status: DC

## 2019-04-10 MED ORDER — SODIUM CHLORIDE 0.9 % IV BOLUS
1000.0000 mL | Freq: Once | INTRAVENOUS | Status: AC
  Administered 2019-04-10: 1000 mL via INTRAVENOUS

## 2019-04-10 NOTE — ED Triage Notes (Signed)
Fatigue, cough, runny nose and diarrhea for a week. He was seen by his MD this am for possible dehydration. He was told to come here for further evaluation.

## 2019-04-10 NOTE — ED Notes (Signed)
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  His close friend is a male and was just tested and will find results later.

## 2019-04-10 NOTE — ED Notes (Signed)
Spoke with pt. About reason we were waiting and explained that the EDP was in with another Pt. At the present time.

## 2019-04-10 NOTE — ED Provider Notes (Signed)
  Physical Exam  BP (!) 123/113   Pulse 80   Temp 99.3 F (37.4 C) (Oral)   Resp 18   SpO2 100%   Physical Exam  ED Course/Procedures     Procedures  Received care at 3:30 PM from Dr. Gilford Raid.  Please see her note for prior history, physical and care.  Briefly this is a 68 year old male with a history diabetes, hypertension, renal transplant, who presents with concern for diarrhea, was found to have low blood pressure by his PCP.  Renal function is at baseline, blood pressures improved in the emergency department with hydration.  Covid test was positive.  Plan at this time is to give 500 cc of fluid, ambulate patient to monitor for hypoxia and consider discharge if stable.  Patient is feeling much improved, not having dyspnea, no hypoxia and is tolerating po. Understands reasons to return to the ED.            Bryan Rios was evaluated in Emergency Department on 04/10/2019 for the symptoms described in the history of present illness. He was evaluated in the context of the global COVID-19 pandemic, which necessitated consideration that the patient might be at risk for infection with the SARS-CoV-2 virus that causes COVID-19. Institutional protocols and algorithms that pertain to the evaluation of patients at risk for COVID-19 are in a state of rapid change based on information released by regulatory bodies including the CDC and federal and state organizations. These policies and algorithms were followed during the patient's care in the ED.       Gareth Morgan, MD 04/11/19 1536

## 2019-04-10 NOTE — ED Provider Notes (Signed)
[facility] EMERGENCY DEPARTMENT Provider Note   CSN: [id number] Arrival date & time: 04/10/19  1158     History Chief Complaint  Patient presents with  . Diarrhea  . Hypotension    Bryan Rios is a 68 y.o. male.  Pt presents to the ED today with diarrhea and low bp.  The pt said he's been sick since 11/30.  He has a hx of HTN, but has not been taking his bp meds b/c his bp has been too low.  The pt does not know if he's been exposed to Covid.  He initially went to his pcp's office, but was sent here b/c he was orthostatic.  PCP said pt has been exposed to covid.  Pt denies fever.  He's not had much appetite.  He is on tacrolimus for kidney transplant.        Past Medical History:  Diagnosis Date  . Diabetes mellitus without complication ([place])   . Hypertension   . Renal disorder     There are no problems to display for this patient.   Past Surgical History:  Procedure Laterality Date  . KIDNEY TRANSPLANT         No family history on file.  Social History   Tobacco Use  . Smoking status: Former Research scientist (life sciences)  . Smokeless tobacco: Never Used  Substance Use Topics  . Alcohol use: Not Currently  . Drug use: Not Currently    Home Medications Prior to Admission medications   Medication Sig Start Date End Date Taking? Authorizing Provider  aspirin EC 81 MG tablet Take 81 mg by mouth daily.   Yes [provider]  ergocalciferol (VITAMIN D2) 1.25 MG (50000 UT) capsule TK ONE C PO WEEKLY 01/17/12  Yes [provider]  insulin aspart (NOVOLOG) 100 UNIT/ML injection Inject into the skin. 10/17/15  Yes [provider]  insulin glargine (LANTUS) 100 UNIT/ML injection Inject into the skin. 10/24/15  Yes [provider]  irbesartan (AVAPRO) 300 MG tablet  11/11/18   [provider]  mycophenolate (MYFORTIC) 180 MG EC tablet Take 360 mg by mouth 2 (two) times daily. 03/03/19   [provider]  simvastatin (ZOCOR) 40  MG tablet  11/27/18   [provider]  tacrolimus (PROGRAF) 0.5 MG capsule TK 3 CS PO QAM AND 2 CS QPM 03/11/19   [provider]    Allergies    Lisinopril, Metformin, Terbinafine hcl, Amlodipine, Nifedipine, and Diltiazem hcl  Review of Systems   Review of Systems  Gastrointestinal: Positive for diarrhea.  Neurological: Positive for weakness.  All other systems reviewed and are negative.   Physical Exam Updated Vital Signs BP 112/75 (BP Location: Right Arm)   Pulse 93   Temp 99.3 F (37.4 C) (Oral)   Resp (!) 23   SpO2 97%   Physical Exam Vitals and nursing note reviewed.  Constitutional:      Appearance: Normal appearance.  HENT:     Head: Normocephalic and atraumatic.     Right Ear: External ear normal.     Left Ear: External ear normal.     Nose: Nose normal.     Mouth/Throat:     Mouth: Mucous membranes are dry.  Eyes:     Extraocular Movements: Extraocular movements intact.     Conjunctiva/sclera: Conjunctivae normal.     Pupils: Pupils are equal, round, and reactive to light.  Cardiovascular:     Rate and Rhythm: Normal rate and regular rhythm.  Pulses: Normal pulses.     Heart sounds: Normal heart sounds.  Pulmonary:     Effort: Pulmonary effort is normal.     Breath sounds: Normal breath sounds.  Abdominal:     General: Abdomen is flat. Bowel sounds are normal.     Palpations: Abdomen is soft.  Musculoskeletal:        General: Normal range of motion.     Cervical back: Normal range of motion and neck supple.  Skin:    General: Skin is warm.     Capillary Refill: Capillary refill takes less than 2 seconds.  Neurological:     General: No focal deficit present.     Mental Status: He is alert and oriented to person, place, and time.  Psychiatric:        Mood and Affect: Mood normal.        Behavior: Behavior normal.        Thought Content: Thought content normal.        Judgment: Judgment normal.     ED Results / Procedures /  Treatments   Labs (all labs ordered are listed, but only abnormal results are displayed) Labs Reviewed  SARS CORONAVIRUS 2 AG (30 MIN TAT) - Abnormal; Notable for the following components:      Result Value   SARS Coronavirus 2 Ag POSITIVE (*)    All other components within normal limits  CBC WITH DIFFERENTIAL/PLATELET - Abnormal; Notable for the following components:   Lymphs Abs 0.4 (*)    All other components within normal limits  COMPREHENSIVE METABOLIC PANEL - Abnormal; Notable for the following components:   Sodium 134 (*)    Glucose, Bld 192 (*)    Creatinine, Ser 1.90 (*)    GFR calc non Af Amer 36 (*)    GFR calc Af Amer 41 (*)    All other components within normal limits  D-DIMER, QUANTITATIVE (NOT AT [facility]) - Abnormal; Notable for the following components:   D-Dimer, Quant 0.55 (*)    All other components within normal limits  CULTURE, BLOOD (ROUTINE X 2)  CULTURE, BLOOD (ROUTINE X 2)  LACTIC ACID, PLASMA  LIPASE, BLOOD  LACTIC ACID, PLASMA  C-REACTIVE PROTEIN  FERRITIN  LACTATE DEHYDROGENASE  PROCALCITONIN  TRIGLYCERIDES  FIBRINOGEN  URINALYSIS, ROUTINE W REFLEX MICROSCOPIC    EKG EKG Interpretation  Date/Time:  Friday April 10 2019 13:10:57 EST Ventricular Rate:  77 PR Interval:    QRS Duration: 105 QT Interval:  371 QTC Calculation: 420 R Axis:   -65 Text Interpretation: Accelerated junctional rhythm Left anterior fascicular block Left ventricular hypertrophy Anterior infarct, old Nonspecific T abnormalities, lateral leads No old tracing to compare Confirmed by Isla Pence [id number]) on 04/10/2019 1:13:17 PM   Radiology DG Chest Port 1 View  Result Date: 04/10/2019 CLINICAL DATA:  Cough, fatigue, runny nose and diarrhea for 1 week. EXAM: PORTABLE CHEST 1 VIEW COMPARISON:  None. FINDINGS: Cardiac silhouette is normal in size. No mediastinal or hilar masses. No evidence of adenopathy. Prominent bronchovascular markings at the lung bases. Mild  additional hazy opacity noted at the left lung base may be due to infection or atelectasis. Remainder of the lungs is clear. No convincing pleural effusion and no pneumothorax. Skeletal structures are grossly intact. IMPRESSION: 1. Mild opacity at the left lung base which may be due to atelectasis or infection. No other evidence of acute cardiopulmonary disease. Electronically Signed   By: Lajean Manes M.D.   On: 04/10/2019 13:00  Procedures Procedures (including critical care time)  Medications Ordered in ED Medications  sodium chloride 0.9 % bolus 500 mL (has no administration in time range)  sodium chloride 0.9 % bolus 1,000 mL (0 mLs Intravenous Stopped 04/10/19 1443)    ED Course  I have reviewed the triage vital signs and the nursing notes.  Pertinent labs & imaging results that were available during my care of the patient were reviewed by me and considered in my medical decision making (see chart for details).    MDM Rules/Calculators/A&P     CHA2DS2/VAS Stroke Risk Points      N/A >= 2 Points: High Risk  1 - 1.99 Points: Medium Risk  0 Points: Low Risk    A final score could not be computed because of missing components.: Last  Change: N/A     This score determines the patient's risk of having a stroke if the  patient has atrial fibrillation.      This score is not applicable to this patient. Components are not  calculated.                    Pt is feeling much better.  He is still orthostatic after 1L.  The pt is given an additional 500 cc.  The pt will be ambulated after that additional 500 cc.  I think pt can go home if he is feeling better.  Pt signed out to Dr. Barbee Cough at shift change.  TESHAUN CRONAN was evaluated in Emergency Department on 04/10/2019 for the symptoms described in the history of present illness. He was evaluated in the context of the global COVID-19 pandemic, which necessitated consideration that the patient might be at risk for infection with  the SARS-CoV-2 virus that causes COVID-19. Institutional protocols and algorithms that pertain to the evaluation of patients at risk for COVID-19 are in a state of rapid change based on information released by regulatory bodies including the CDC and federal and state organizations. These policies and algorithms were followed during the patient's care in the ED.   Final Clinical Impression(s) / ED Diagnoses Final diagnoses:  COVID-19 virus detected    Rx / DC Orders ED Discharge Orders    None       Isla Pence, MD 04/10/19 1529

## 2019-04-15 LAB — CULTURE, BLOOD (ROUTINE X 2)
Culture: NO GROWTH
Culture: NO GROWTH
Special Requests: ADEQUATE
Special Requests: ADEQUATE

## 2019-06-05 DIAGNOSIS — Z940 Kidney transplant status: Secondary | ICD-10-CM | POA: Diagnosis not present

## 2019-06-08 DIAGNOSIS — Z940 Kidney transplant status: Secondary | ICD-10-CM | POA: Diagnosis not present

## 2019-06-08 DIAGNOSIS — N390 Urinary tract infection, site not specified: Secondary | ICD-10-CM | POA: Diagnosis not present

## 2019-06-11 DIAGNOSIS — I10 Essential (primary) hypertension: Secondary | ICD-10-CM | POA: Diagnosis not present

## 2019-06-11 DIAGNOSIS — Z794 Long term (current) use of insulin: Secondary | ICD-10-CM | POA: Diagnosis not present

## 2019-06-11 DIAGNOSIS — D84821 Immunodeficiency due to drugs: Secondary | ICD-10-CM | POA: Diagnosis not present

## 2019-06-11 DIAGNOSIS — Z4822 Encounter for aftercare following kidney transplant: Secondary | ICD-10-CM | POA: Diagnosis not present

## 2019-06-11 DIAGNOSIS — Z7982 Long term (current) use of aspirin: Secondary | ICD-10-CM | POA: Diagnosis not present

## 2019-06-11 DIAGNOSIS — E785 Hyperlipidemia, unspecified: Secondary | ICD-10-CM | POA: Diagnosis not present

## 2019-06-11 DIAGNOSIS — Z79899 Other long term (current) drug therapy: Secondary | ICD-10-CM | POA: Diagnosis not present

## 2019-06-11 DIAGNOSIS — Z2821 Immunization not carried out because of patient refusal: Secondary | ICD-10-CM | POA: Diagnosis not present

## 2019-06-11 DIAGNOSIS — Z940 Kidney transplant status: Secondary | ICD-10-CM | POA: Diagnosis not present

## 2019-06-11 DIAGNOSIS — E119 Type 2 diabetes mellitus without complications: Secondary | ICD-10-CM | POA: Diagnosis not present

## 2019-06-11 LAB — LIPID PROFILE (EXT)
Chol/HDL Ratio (EXT): 5 — ABNORMAL HIGH (ref <4.5)
Cholesterol (EXT): 245 mg/dL — ABNORMAL HIGH (ref 25–199)
HDL Cholesterol (EXT): 49 mg/dL (ref 35–135)
LDL Cholesterol (EXT): 171 mg/dL — ABNORMAL HIGH (ref <130)
NON HDL Cholesterol (EXT): 196 mg/dL
Triglycerides (EXT): 109 mg/dL (ref 10–150)

## 2019-06-11 LAB — UNMAPPED LAB RESULTS: Phosphorous (EXT): 3.7 mg/dL (ref 2.5–4.5)

## 2019-06-22 DIAGNOSIS — U071 COVID-19: Secondary | ICD-10-CM | POA: Diagnosis not present

## 2019-06-22 DIAGNOSIS — N1832 Chronic kidney disease, stage 3b: Secondary | ICD-10-CM | POA: Diagnosis not present

## 2019-06-22 DIAGNOSIS — N179 Acute kidney failure, unspecified: Secondary | ICD-10-CM | POA: Diagnosis not present

## 2019-06-22 DIAGNOSIS — E1165 Type 2 diabetes mellitus with hyperglycemia: Secondary | ICD-10-CM | POA: Diagnosis not present

## 2019-06-22 DIAGNOSIS — I10 Essential (primary) hypertension: Secondary | ICD-10-CM | POA: Diagnosis not present

## 2019-06-22 DIAGNOSIS — E782 Mixed hyperlipidemia: Secondary | ICD-10-CM | POA: Diagnosis not present

## 2019-06-22 DIAGNOSIS — E559 Vitamin D deficiency, unspecified: Secondary | ICD-10-CM | POA: Diagnosis not present

## 2019-06-22 DIAGNOSIS — Z87891 Personal history of nicotine dependence: Secondary | ICD-10-CM | POA: Diagnosis not present

## 2019-06-29 DIAGNOSIS — L918 Other hypertrophic disorders of the skin: Secondary | ICD-10-CM | POA: Diagnosis not present

## 2019-06-29 DIAGNOSIS — B351 Tinea unguium: Secondary | ICD-10-CM | POA: Diagnosis not present

## 2019-06-29 DIAGNOSIS — L821 Other seborrheic keratosis: Secondary | ICD-10-CM | POA: Diagnosis not present

## 2019-06-30 DIAGNOSIS — Z794 Long term (current) use of insulin: Secondary | ICD-10-CM | POA: Diagnosis not present

## 2019-06-30 DIAGNOSIS — H5213 Myopia, bilateral: Secondary | ICD-10-CM | POA: Diagnosis not present

## 2019-06-30 DIAGNOSIS — H52201 Unspecified astigmatism, right eye: Secondary | ICD-10-CM | POA: Diagnosis not present

## 2019-06-30 DIAGNOSIS — H524 Presbyopia: Secondary | ICD-10-CM | POA: Diagnosis not present

## 2019-06-30 DIAGNOSIS — H2513 Age-related nuclear cataract, bilateral: Secondary | ICD-10-CM | POA: Diagnosis not present

## 2019-06-30 DIAGNOSIS — H25013 Cortical age-related cataract, bilateral: Secondary | ICD-10-CM | POA: Diagnosis not present

## 2019-06-30 DIAGNOSIS — E113293 Type 2 diabetes mellitus with mild nonproliferative diabetic retinopathy without macular edema, bilateral: Secondary | ICD-10-CM | POA: Diagnosis not present

## 2019-07-13 DIAGNOSIS — H2513 Age-related nuclear cataract, bilateral: Secondary | ICD-10-CM | POA: Diagnosis not present

## 2019-07-13 DIAGNOSIS — H524 Presbyopia: Secondary | ICD-10-CM | POA: Diagnosis not present

## 2019-07-13 DIAGNOSIS — H5213 Myopia, bilateral: Secondary | ICD-10-CM | POA: Diagnosis not present

## 2019-07-13 DIAGNOSIS — H25013 Cortical age-related cataract, bilateral: Secondary | ICD-10-CM | POA: Diagnosis not present

## 2019-07-13 DIAGNOSIS — H18413 Arcus senilis, bilateral: Secondary | ICD-10-CM | POA: Diagnosis not present

## 2019-07-13 DIAGNOSIS — H5371 Glare sensitivity: Secondary | ICD-10-CM | POA: Diagnosis not present

## 2019-07-13 DIAGNOSIS — H52203 Unspecified astigmatism, bilateral: Secondary | ICD-10-CM | POA: Diagnosis not present

## 2019-07-13 DIAGNOSIS — Z794 Long term (current) use of insulin: Secondary | ICD-10-CM | POA: Diagnosis not present

## 2019-07-13 DIAGNOSIS — H52209 Unspecified astigmatism, unspecified eye: Secondary | ICD-10-CM | POA: Diagnosis not present

## 2019-07-13 DIAGNOSIS — H35043 Retinal micro-aneurysms, unspecified, bilateral: Secondary | ICD-10-CM | POA: Diagnosis not present

## 2019-07-13 DIAGNOSIS — E113293 Type 2 diabetes mellitus with mild nonproliferative diabetic retinopathy without macular edema, bilateral: Secondary | ICD-10-CM | POA: Diagnosis not present

## 2019-09-25 DIAGNOSIS — E782 Mixed hyperlipidemia: Secondary | ICD-10-CM | POA: Diagnosis not present

## 2019-09-25 DIAGNOSIS — E1165 Type 2 diabetes mellitus with hyperglycemia: Secondary | ICD-10-CM | POA: Diagnosis not present

## 2019-09-25 DIAGNOSIS — Z0000 Encounter for general adult medical examination without abnormal findings: Secondary | ICD-10-CM | POA: Diagnosis not present

## 2019-09-25 DIAGNOSIS — I10 Essential (primary) hypertension: Secondary | ICD-10-CM | POA: Diagnosis not present

## 2019-09-25 DIAGNOSIS — Z125 Encounter for screening for malignant neoplasm of prostate: Secondary | ICD-10-CM | POA: Diagnosis not present

## 2019-09-25 DIAGNOSIS — N1832 Chronic kidney disease, stage 3b: Secondary | ICD-10-CM | POA: Diagnosis not present

## 2019-09-25 DIAGNOSIS — Z1389 Encounter for screening for other disorder: Secondary | ICD-10-CM | POA: Diagnosis not present

## 2019-09-25 DIAGNOSIS — Z0100 Encounter for examination of eyes and vision without abnormal findings: Secondary | ICD-10-CM | POA: Diagnosis not present

## 2019-09-25 DIAGNOSIS — Z0110 Encounter for examination of ears and hearing without abnormal findings: Secondary | ICD-10-CM | POA: Diagnosis not present

## 2019-11-08 ENCOUNTER — Emergency Department (HOSPITAL_BASED_OUTPATIENT_CLINIC_OR_DEPARTMENT_OTHER)
Admission: EM | Admit: 2019-11-08 | Discharge: 2019-11-08 | Disposition: A | Discharge status: Home / Self Care | Payer: Medicare HMO | Attending: Emergency Medicine | Admitting: Emergency Medicine

## 2019-11-08 ENCOUNTER — Encounter (HOSPITAL_BASED_OUTPATIENT_CLINIC_OR_DEPARTMENT_OTHER): Payer: Self-pay | Admitting: *Deleted

## 2019-11-08 ENCOUNTER — Other Ambulatory Visit: Payer: Self-pay

## 2019-11-08 DIAGNOSIS — T148XXA Other injury of unspecified body region, initial encounter: Secondary | ICD-10-CM | POA: Diagnosis not present

## 2019-11-08 DIAGNOSIS — I10 Essential (primary) hypertension: Secondary | ICD-10-CM | POA: Diagnosis not present

## 2019-11-08 DIAGNOSIS — Y92410 Unspecified street and highway as the place of occurrence of the external cause: Secondary | ICD-10-CM | POA: Diagnosis not present

## 2019-11-08 DIAGNOSIS — Y9389 Activity, other specified: Secondary | ICD-10-CM | POA: Insufficient documentation

## 2019-11-08 DIAGNOSIS — M542 Cervicalgia: Secondary | ICD-10-CM | POA: Diagnosis not present

## 2019-11-08 DIAGNOSIS — S46812A Strain of other muscles, fascia and tendons at shoulder and upper arm level, left arm, initial encounter: Secondary | ICD-10-CM | POA: Diagnosis not present

## 2019-11-08 DIAGNOSIS — Y999 Unspecified external cause status: Secondary | ICD-10-CM | POA: Insufficient documentation

## 2019-11-08 DIAGNOSIS — R52 Pain, unspecified: Secondary | ICD-10-CM | POA: Diagnosis not present

## 2019-11-08 DIAGNOSIS — S3992XA Unspecified injury of lower back, initial encounter: Secondary | ICD-10-CM | POA: Diagnosis present

## 2019-11-08 DIAGNOSIS — S46811A Strain of other muscles, fascia and tendons at shoulder and upper arm level, right arm, initial encounter: Secondary | ICD-10-CM | POA: Diagnosis not present

## 2019-11-08 DIAGNOSIS — E119 Type 2 diabetes mellitus without complications: Secondary | ICD-10-CM | POA: Diagnosis not present

## 2019-11-08 NOTE — Discharge Instructions (Addendum)
You were evaluated in the Emergency Department and after careful evaluation, we did not find any emergent condition requiring admission or further testing in the hospital.  Your exam/testing today was overall reassuring.  No signs of significant injuries.  We suspect that your muscles are strained and will likely be a bit more sore tomorrow.  Please use Tylenol at home for discomfort as well as cold compresses.  Please return to the Emergency Department if you experience any worsening of your condition.  Thank you for allowing [place] to be a part of your care.
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
## 2019-11-12 ENCOUNTER — Emergency Department (HOSPITAL_BASED_OUTPATIENT_CLINIC_OR_DEPARTMENT_OTHER)
Admission: EM | Admit: 2019-11-12 | Discharge: 2019-11-12 | Disposition: A | Discharge status: Home / Self Care | Payer: Medicare HMO | Attending: Emergency Medicine | Admitting: Emergency Medicine

## 2019-11-12 ENCOUNTER — Encounter (HOSPITAL_BASED_OUTPATIENT_CLINIC_OR_DEPARTMENT_OTHER): Payer: Self-pay | Admitting: Emergency Medicine

## 2019-11-12 ENCOUNTER — Other Ambulatory Visit: Payer: Self-pay

## 2019-11-12 DIAGNOSIS — Z7982 Long term (current) use of aspirin: Secondary | ICD-10-CM | POA: Insufficient documentation

## 2019-11-12 DIAGNOSIS — I10 Essential (primary) hypertension: Secondary | ICD-10-CM | POA: Diagnosis not present

## 2019-11-12 DIAGNOSIS — E119 Type 2 diabetes mellitus without complications: Secondary | ICD-10-CM | POA: Insufficient documentation

## 2019-11-12 DIAGNOSIS — M25511 Pain in right shoulder: Secondary | ICD-10-CM | POA: Diagnosis present

## 2019-11-12 DIAGNOSIS — Z87891 Personal history of nicotine dependence: Secondary | ICD-10-CM | POA: Diagnosis not present

## 2019-11-12 DIAGNOSIS — Z940 Kidney transplant status: Secondary | ICD-10-CM | POA: Insufficient documentation

## 2019-11-12 DIAGNOSIS — Z794 Long term (current) use of insulin: Secondary | ICD-10-CM | POA: Diagnosis not present

## 2019-11-12 DIAGNOSIS — S161XXD Strain of muscle, fascia and tendon at neck level, subsequent encounter: Secondary | ICD-10-CM | POA: Insufficient documentation

## 2019-11-12 DIAGNOSIS — M25512 Pain in left shoulder: Secondary | ICD-10-CM | POA: Insufficient documentation

## 2019-11-12 MED ORDER — CYCLOBENZAPRINE HCL 10 MG PO TABS: 10.0000 mg | ORAL_TABLET | Freq: Two times a day (BID) | ORAL | 0 refills | Status: DC | PRN
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
## 2019-11-13 DIAGNOSIS — M19012 Primary osteoarthritis, left shoulder: Secondary | ICD-10-CM | POA: Diagnosis not present

## 2019-11-17 DIAGNOSIS — M25512 Pain in left shoulder: Secondary | ICD-10-CM | POA: Diagnosis not present

## 2019-11-20 DIAGNOSIS — M25512 Pain in left shoulder: Secondary | ICD-10-CM | POA: Diagnosis not present

## 2019-11-20 DIAGNOSIS — M542 Cervicalgia: Secondary | ICD-10-CM | POA: Diagnosis not present

## 2019-11-24 DIAGNOSIS — M25512 Pain in left shoulder: Secondary | ICD-10-CM | POA: Diagnosis not present

## 2019-11-25 DIAGNOSIS — M25512 Pain in left shoulder: Secondary | ICD-10-CM | POA: Diagnosis not present

## 2019-11-25 DIAGNOSIS — M542 Cervicalgia: Secondary | ICD-10-CM | POA: Diagnosis not present

## 2019-11-27 DIAGNOSIS — M25512 Pain in left shoulder: Secondary | ICD-10-CM | POA: Diagnosis not present

## 2019-11-27 DIAGNOSIS — M542 Cervicalgia: Secondary | ICD-10-CM | POA: Diagnosis not present

## 2019-12-01 DIAGNOSIS — M542 Cervicalgia: Secondary | ICD-10-CM | POA: Diagnosis not present

## 2019-12-01 DIAGNOSIS — M25512 Pain in left shoulder: Secondary | ICD-10-CM | POA: Diagnosis not present

## 2019-12-03 DIAGNOSIS — E1165 Type 2 diabetes mellitus with hyperglycemia: Secondary | ICD-10-CM | POA: Diagnosis not present

## 2019-12-03 DIAGNOSIS — M542 Cervicalgia: Secondary | ICD-10-CM | POA: Diagnosis not present

## 2019-12-03 DIAGNOSIS — Z87891 Personal history of nicotine dependence: Secondary | ICD-10-CM | POA: Diagnosis not present

## 2019-12-03 DIAGNOSIS — N1832 Chronic kidney disease, stage 3b: Secondary | ICD-10-CM | POA: Diagnosis not present

## 2019-12-03 DIAGNOSIS — Z136 Encounter for screening for cardiovascular disorders: Secondary | ICD-10-CM | POA: Diagnosis not present

## 2019-12-03 DIAGNOSIS — I10 Essential (primary) hypertension: Secondary | ICD-10-CM | POA: Diagnosis not present

## 2019-12-03 DIAGNOSIS — M25512 Pain in left shoulder: Secondary | ICD-10-CM | POA: Diagnosis not present

## 2019-12-03 DIAGNOSIS — E782 Mixed hyperlipidemia: Secondary | ICD-10-CM | POA: Diagnosis not present

## 2019-12-03 DIAGNOSIS — E559 Vitamin D deficiency, unspecified: Secondary | ICD-10-CM | POA: Diagnosis not present

## 2019-12-08 DIAGNOSIS — M542 Cervicalgia: Secondary | ICD-10-CM | POA: Diagnosis not present

## 2019-12-08 DIAGNOSIS — M25512 Pain in left shoulder: Secondary | ICD-10-CM | POA: Diagnosis not present

## 2019-12-10 DIAGNOSIS — Z940 Kidney transplant status: Secondary | ICD-10-CM | POA: Diagnosis not present

## 2019-12-14 DIAGNOSIS — Z940 Kidney transplant status: Secondary | ICD-10-CM | POA: Diagnosis not present

## 2019-12-14 DIAGNOSIS — N390 Urinary tract infection, site not specified: Secondary | ICD-10-CM | POA: Diagnosis not present

## 2019-12-15 DIAGNOSIS — M25512 Pain in left shoulder: Secondary | ICD-10-CM | POA: Diagnosis not present

## 2019-12-15 DIAGNOSIS — M542 Cervicalgia: Secondary | ICD-10-CM | POA: Diagnosis not present

## 2019-12-18 DIAGNOSIS — M25512 Pain in left shoulder: Secondary | ICD-10-CM | POA: Diagnosis not present

## 2019-12-18 DIAGNOSIS — M542 Cervicalgia: Secondary | ICD-10-CM | POA: Diagnosis not present

## 2019-12-28 DIAGNOSIS — G8918 Other acute postprocedural pain: Secondary | ICD-10-CM | POA: Diagnosis not present

## 2019-12-28 DIAGNOSIS — M7522 Bicipital tendinitis, left shoulder: Secondary | ICD-10-CM | POA: Diagnosis not present

## 2019-12-28 DIAGNOSIS — M24112 Other articular cartilage disorders, left shoulder: Secondary | ICD-10-CM | POA: Diagnosis not present

## 2019-12-28 DIAGNOSIS — M7542 Impingement syndrome of left shoulder: Secondary | ICD-10-CM | POA: Diagnosis not present

## 2019-12-28 DIAGNOSIS — M7552 Bursitis of left shoulder: Secondary | ICD-10-CM | POA: Diagnosis not present

## 2019-12-28 DIAGNOSIS — M19012 Primary osteoarthritis, left shoulder: Secondary | ICD-10-CM | POA: Diagnosis not present

## 2020-01-05 DIAGNOSIS — M19012 Primary osteoarthritis, left shoulder: Secondary | ICD-10-CM | POA: Diagnosis not present

## 2020-01-12 DIAGNOSIS — M25512 Pain in left shoulder: Secondary | ICD-10-CM | POA: Diagnosis not present

## 2020-01-20 DIAGNOSIS — M25512 Pain in left shoulder: Secondary | ICD-10-CM | POA: Diagnosis not present

## 2020-01-28 DIAGNOSIS — M25512 Pain in left shoulder: Secondary | ICD-10-CM | POA: Diagnosis not present

## 2020-02-02 DIAGNOSIS — M25512 Pain in left shoulder: Secondary | ICD-10-CM | POA: Diagnosis not present

## 2020-02-04 DIAGNOSIS — Z1321 Encounter for screening for nutritional disorder: Secondary | ICD-10-CM | POA: Diagnosis not present

## 2020-02-04 DIAGNOSIS — Z131 Encounter for screening for diabetes mellitus: Secondary | ICD-10-CM | POA: Diagnosis not present

## 2020-02-04 DIAGNOSIS — E1165 Type 2 diabetes mellitus with hyperglycemia: Secondary | ICD-10-CM | POA: Diagnosis not present

## 2020-02-04 DIAGNOSIS — E782 Mixed hyperlipidemia: Secondary | ICD-10-CM | POA: Diagnosis not present

## 2020-02-04 DIAGNOSIS — M25512 Pain in left shoulder: Secondary | ICD-10-CM | POA: Diagnosis not present

## 2020-02-04 DIAGNOSIS — Z1329 Encounter for screening for other suspected endocrine disorder: Secondary | ICD-10-CM | POA: Diagnosis not present

## 2020-02-04 DIAGNOSIS — Z136 Encounter for screening for cardiovascular disorders: Secondary | ICD-10-CM | POA: Diagnosis not present

## 2020-02-04 DIAGNOSIS — I10 Essential (primary) hypertension: Secondary | ICD-10-CM | POA: Diagnosis not present

## 2020-02-04 DIAGNOSIS — Z2821 Immunization not carried out because of patient refusal: Secondary | ICD-10-CM | POA: Diagnosis not present

## 2020-02-04 DIAGNOSIS — Z125 Encounter for screening for malignant neoplasm of prostate: Secondary | ICD-10-CM | POA: Diagnosis not present

## 2020-02-04 DIAGNOSIS — Z0001 Encounter for general adult medical examination with abnormal findings: Secondary | ICD-10-CM | POA: Diagnosis not present

## 2020-02-09 DIAGNOSIS — M25512 Pain in left shoulder: Secondary | ICD-10-CM | POA: Diagnosis not present

## 2020-02-11 DIAGNOSIS — M25512 Pain in left shoulder: Secondary | ICD-10-CM | POA: Diagnosis not present

## 2020-02-16 DIAGNOSIS — M25512 Pain in left shoulder: Secondary | ICD-10-CM | POA: Diagnosis not present

## 2020-02-18 DIAGNOSIS — M25512 Pain in left shoulder: Secondary | ICD-10-CM | POA: Diagnosis not present

## 2020-02-25 DIAGNOSIS — M25512 Pain in left shoulder: Secondary | ICD-10-CM | POA: Diagnosis not present

## 2020-03-01 DIAGNOSIS — M25512 Pain in left shoulder: Secondary | ICD-10-CM | POA: Diagnosis not present

## 2020-03-03 DIAGNOSIS — M25512 Pain in left shoulder: Secondary | ICD-10-CM | POA: Diagnosis not present

## 2020-03-08 DIAGNOSIS — M25512 Pain in left shoulder: Secondary | ICD-10-CM | POA: Diagnosis not present

## 2020-03-10 DIAGNOSIS — I10 Essential (primary) hypertension: Secondary | ICD-10-CM | POA: Diagnosis not present

## 2020-03-10 DIAGNOSIS — E559 Vitamin D deficiency, unspecified: Secondary | ICD-10-CM | POA: Diagnosis not present

## 2020-03-10 DIAGNOSIS — N1832 Chronic kidney disease, stage 3b: Secondary | ICD-10-CM | POA: Diagnosis not present

## 2020-03-10 DIAGNOSIS — Z87891 Personal history of nicotine dependence: Secondary | ICD-10-CM | POA: Diagnosis not present

## 2020-03-10 DIAGNOSIS — M25512 Pain in left shoulder: Secondary | ICD-10-CM | POA: Diagnosis not present

## 2020-03-10 DIAGNOSIS — E782 Mixed hyperlipidemia: Secondary | ICD-10-CM | POA: Diagnosis not present

## 2020-03-10 DIAGNOSIS — E1165 Type 2 diabetes mellitus with hyperglycemia: Secondary | ICD-10-CM | POA: Diagnosis not present

## 2020-03-15 DIAGNOSIS — M25512 Pain in left shoulder: Secondary | ICD-10-CM | POA: Diagnosis not present

## 2020-03-17 DIAGNOSIS — M25512 Pain in left shoulder: Secondary | ICD-10-CM | POA: Diagnosis not present

## 2020-03-22 DIAGNOSIS — M19012 Primary osteoarthritis, left shoulder: Secondary | ICD-10-CM | POA: Diagnosis not present

## 2020-03-29 DIAGNOSIS — M25512 Pain in left shoulder: Secondary | ICD-10-CM | POA: Diagnosis not present

## 2020-03-31 DIAGNOSIS — M25512 Pain in left shoulder: Secondary | ICD-10-CM | POA: Diagnosis not present

## 2020-04-06 DIAGNOSIS — M25512 Pain in left shoulder: Secondary | ICD-10-CM | POA: Diagnosis not present

## 2020-04-08 DIAGNOSIS — M25512 Pain in left shoulder: Secondary | ICD-10-CM | POA: Diagnosis not present

## 2020-04-12 DIAGNOSIS — M25512 Pain in left shoulder: Secondary | ICD-10-CM | POA: Diagnosis not present

## 2020-06-08 DIAGNOSIS — Z940 Kidney transplant status: Secondary | ICD-10-CM | POA: Diagnosis not present

## 2020-06-13 DIAGNOSIS — Z940 Kidney transplant status: Secondary | ICD-10-CM | POA: Diagnosis not present

## 2020-06-13 DIAGNOSIS — N390 Urinary tract infection, site not specified: Secondary | ICD-10-CM | POA: Diagnosis not present

## 2020-06-15 DIAGNOSIS — I10 Essential (primary) hypertension: Secondary | ICD-10-CM | POA: Diagnosis not present

## 2020-06-15 DIAGNOSIS — Z940 Kidney transplant status: Secondary | ICD-10-CM | POA: Diagnosis not present

## 2020-06-15 DIAGNOSIS — E785 Hyperlipidemia, unspecified: Secondary | ICD-10-CM | POA: Diagnosis not present

## 2020-06-15 DIAGNOSIS — Z79899 Other long term (current) drug therapy: Secondary | ICD-10-CM | POA: Diagnosis not present

## 2020-06-15 DIAGNOSIS — Z87891 Personal history of nicotine dependence: Secondary | ICD-10-CM | POA: Diagnosis not present

## 2020-06-15 DIAGNOSIS — E119 Type 2 diabetes mellitus without complications: Secondary | ICD-10-CM | POA: Diagnosis not present

## 2020-06-15 DIAGNOSIS — Z794 Long term (current) use of insulin: Secondary | ICD-10-CM | POA: Diagnosis not present

## 2020-06-15 DIAGNOSIS — Z4822 Encounter for aftercare following kidney transplant: Secondary | ICD-10-CM | POA: Diagnosis not present

## 2020-06-15 DIAGNOSIS — D849 Immunodeficiency, unspecified: Secondary | ICD-10-CM | POA: Diagnosis not present

## 2020-06-15 LAB — LIPID PROFILE (EXT)
Chol/HDL Ratio (EXT): 3.9 (ref <4.5)
Cholesterol (EXT): 158 mg/dL (ref 25–199)
HDL Cholesterol (EXT): 41 mg/dL (ref 35–135)
LDL Cholesterol (EXT): 95 mg/dL (ref <130)
NON HDL Cholesterol (EXT): 117 mg/dL
Triglycerides (EXT): 102 mg/dL (ref 10–150)

## 2020-06-15 LAB — UNMAPPED LAB RESULTS: Phosphorous (EXT): 4 mg/dL (ref 2.5–4.5)

## 2020-06-27 DIAGNOSIS — E1165 Type 2 diabetes mellitus with hyperglycemia: Secondary | ICD-10-CM | POA: Diagnosis not present

## 2020-06-27 DIAGNOSIS — E559 Vitamin D deficiency, unspecified: Secondary | ICD-10-CM | POA: Diagnosis not present

## 2020-06-27 DIAGNOSIS — Z87891 Personal history of nicotine dependence: Secondary | ICD-10-CM | POA: Diagnosis not present

## 2020-06-27 DIAGNOSIS — E782 Mixed hyperlipidemia: Secondary | ICD-10-CM | POA: Diagnosis not present

## 2020-06-27 DIAGNOSIS — N1832 Chronic kidney disease, stage 3b: Secondary | ICD-10-CM | POA: Diagnosis not present

## 2020-06-27 DIAGNOSIS — I10 Essential (primary) hypertension: Secondary | ICD-10-CM | POA: Diagnosis not present

## 2020-09-12 ENCOUNTER — Encounter (HOSPITAL_BASED_OUTPATIENT_CLINIC_OR_DEPARTMENT_OTHER): Payer: Self-pay

## 2020-09-12 ENCOUNTER — Inpatient Hospital Stay (HOSPITAL_BASED_OUTPATIENT_CLINIC_OR_DEPARTMENT_OTHER)
Admission: EM | Admit: 2020-09-12 | Discharge: 2020-09-14 | DRG: 392 | Disposition: A | Discharge status: Home / Self Care | Payer: HMO | Attending: Internal Medicine | Admitting: Internal Medicine

## 2020-09-12 ENCOUNTER — Encounter (HOSPITAL_COMMUNITY)
Admission: EM | Disposition: A | Discharge status: Home / Self Care | Payer: Self-pay | Source: Home / Self Care | Attending: Internal Medicine

## 2020-09-12 ENCOUNTER — Other Ambulatory Visit: Payer: Self-pay

## 2020-09-12 ENCOUNTER — Emergency Department (HOSPITAL_BASED_OUTPATIENT_CLINIC_OR_DEPARTMENT_OTHER): Payer: HMO

## 2020-09-12 DIAGNOSIS — A059 Bacterial foodborne intoxication, unspecified: Secondary | ICD-10-CM | POA: Diagnosis not present

## 2020-09-12 DIAGNOSIS — R112 Nausea with vomiting, unspecified: Secondary | ICD-10-CM

## 2020-09-12 DIAGNOSIS — Z20822 Contact with and (suspected) exposure to covid-19: Secondary | ICD-10-CM | POA: Diagnosis present

## 2020-09-12 DIAGNOSIS — I213 ST elevation (STEMI) myocardial infarction of unspecified site: Secondary | ICD-10-CM

## 2020-09-12 DIAGNOSIS — R1110 Vomiting, unspecified: Secondary | ICD-10-CM

## 2020-09-12 DIAGNOSIS — R42 Dizziness and giddiness: Secondary | ICD-10-CM | POA: Diagnosis not present

## 2020-09-12 DIAGNOSIS — E1122 Type 2 diabetes mellitus with diabetic chronic kidney disease: Secondary | ICD-10-CM | POA: Diagnosis present

## 2020-09-12 DIAGNOSIS — R197 Diarrhea, unspecified: Secondary | ICD-10-CM

## 2020-09-12 DIAGNOSIS — N1832 Chronic kidney disease, stage 3b: Secondary | ICD-10-CM | POA: Diagnosis present

## 2020-09-12 DIAGNOSIS — Z87891 Personal history of nicotine dependence: Secondary | ICD-10-CM

## 2020-09-12 DIAGNOSIS — R0789 Other chest pain: Secondary | ICD-10-CM | POA: Diagnosis present

## 2020-09-12 DIAGNOSIS — R9431 Abnormal electrocardiogram [ECG] [EKG]: Secondary | ICD-10-CM | POA: Diagnosis present

## 2020-09-12 DIAGNOSIS — E785 Hyperlipidemia, unspecified: Secondary | ICD-10-CM | POA: Diagnosis present

## 2020-09-12 DIAGNOSIS — Z888 Allergy status to other drugs, medicaments and biological substances status: Secondary | ICD-10-CM

## 2020-09-12 DIAGNOSIS — Z940 Kidney transplant status: Secondary | ICD-10-CM

## 2020-09-12 DIAGNOSIS — D84821 Immunodeficiency due to drugs: Secondary | ICD-10-CM | POA: Diagnosis present

## 2020-09-12 DIAGNOSIS — Z833 Family history of diabetes mellitus: Secondary | ICD-10-CM

## 2020-09-12 DIAGNOSIS — Z7982 Long term (current) use of aspirin: Secondary | ICD-10-CM

## 2020-09-12 DIAGNOSIS — Z794 Long term (current) use of insulin: Secondary | ICD-10-CM

## 2020-09-12 DIAGNOSIS — R001 Bradycardia, unspecified: Secondary | ICD-10-CM | POA: Diagnosis present

## 2020-09-12 DIAGNOSIS — I129 Hypertensive chronic kidney disease with stage 1 through stage 4 chronic kidney disease, or unspecified chronic kidney disease: Secondary | ICD-10-CM | POA: Diagnosis present

## 2020-09-12 DIAGNOSIS — Z79899 Other long term (current) drug therapy: Secondary | ICD-10-CM

## 2020-09-12 LAB — COMPREHENSIVE METABOLIC PANEL
ALT: 15 U/L (ref 0–44)
AST: 20 U/L (ref 15–41)
Albumin: 4.6 g/dL (ref 3.5–5.0)
Alkaline Phosphatase: 54 U/L (ref 38–126)
Anion gap: 8 (ref 5–15)
BUN: 25 mg/dL — ABNORMAL HIGH (ref 8–23)
CO2: 22 mmol/L (ref 22–32)
Calcium: 9.5 mg/dL (ref 8.9–10.3)
Chloride: 109 mmol/L (ref 98–111)
Creatinine, Ser: 1.68 mg/dL — ABNORMAL HIGH (ref 0.61–1.24)
GFR, Estimated: 44 mL/min — ABNORMAL LOW (ref >60)
Glucose, Bld: 140 mg/dL — ABNORMAL HIGH (ref 70–99)
Potassium: 3.9 mmol/L (ref 3.5–5.1)
Sodium: 139 mmol/L (ref 135–145)
Total Bilirubin: 0.4 mg/dL (ref 0.3–1.2)
Total Protein: 7.9 g/dL (ref 6.5–8.1)

## 2020-09-12 LAB — TROPONIN I (HIGH SENSITIVITY)
Troponin I (High Sensitivity): 5 ng/L (ref <18)
Troponin I (High Sensitivity): 9 ng/L (ref <18)

## 2020-09-12 LAB — CBC
HCT: 41.4 % (ref 39.0–52.0)
Hemoglobin: 13.2 g/dL (ref 13.0–17.0)
MCH: 26.5 pg (ref 26.0–34.0)
MCHC: 31.9 g/dL (ref 30.0–36.0)
MCV: 83.1 fL (ref 80.0–100.0)
Platelets: 187 10*3/uL (ref 150–400)
RBC: 4.98 MIL/uL (ref 4.22–5.81)
RDW: 13.1 % (ref 11.5–15.5)
WBC: 5.4 10*3/uL (ref 4.0–10.5)
nRBC: 0 % (ref 0.0–0.2)

## 2020-09-12 LAB — URINALYSIS, ROUTINE W REFLEX MICROSCOPIC
Bilirubin Urine: NEGATIVE
Glucose, UA: NEGATIVE mg/dL
Hgb urine dipstick: NEGATIVE
Ketones, ur: NEGATIVE mg/dL
Leukocytes,Ua: NEGATIVE
Nitrite: NEGATIVE
Protein, ur: 30 mg/dL — AB
Specific Gravity, Urine: 1.02 (ref 1.005–1.030)
pH: 7 (ref 5.0–8.0)

## 2020-09-12 LAB — CBG MONITORING, ED: Glucose-Capillary: 128 mg/dL — ABNORMAL HIGH (ref 70–99)

## 2020-09-12 LAB — LIPASE, BLOOD: Lipase: 34 U/L (ref 11–51)

## 2020-09-12 LAB — PROTIME-INR
INR: 1 (ref 0.8–1.2)
Prothrombin Time: 13.4 seconds (ref 11.4–15.2)

## 2020-09-12 LAB — RESP PANEL BY RT-PCR (FLU A&B, COVID) ARPGX2
Influenza A by PCR: NEGATIVE
Influenza B by PCR: NEGATIVE
SARS Coronavirus 2 by RT PCR: NEGATIVE

## 2020-09-12 LAB — URINALYSIS, MICROSCOPIC (REFLEX): WBC, UA: NONE SEEN WBC/hpf (ref 0–5)

## 2020-09-12 LAB — APTT: aPTT: 25 seconds (ref 24–36)

## 2020-09-12 IMAGING — CT CT HEAD W/O CM
3 series · 16 of 47 positions shown, 19 images · non-contrast
Comparison: None.

CLINICAL DATA: Dizziness

EXAM:
CT HEAD WITHOUT CONTRAST
TECHNIQUE: Contiguous axial images were obtained from the base of the skull
through the vertex without intravenous contrast.

[Series 2: head wo · axial · 0.43mm/px · z∈[+1154,+1310]mm · 10 of 37 slices shown, 13 images]
[im 3/37  brain]
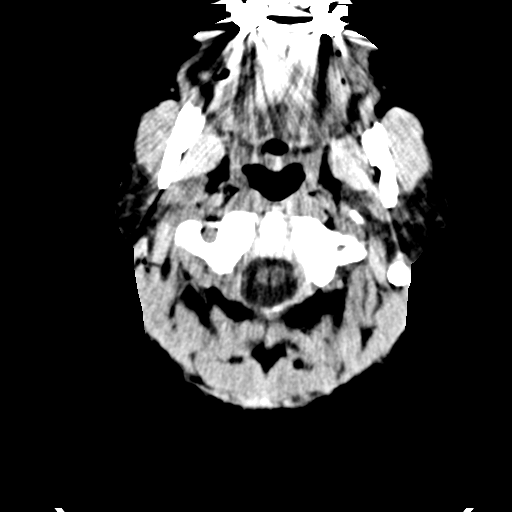
[im 3/37  bone]
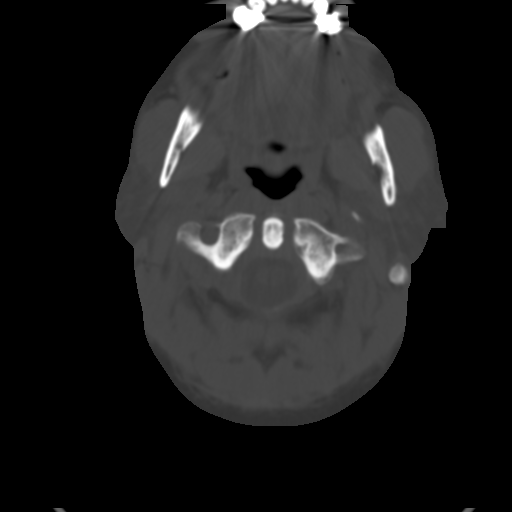
[im 7/37  brain]
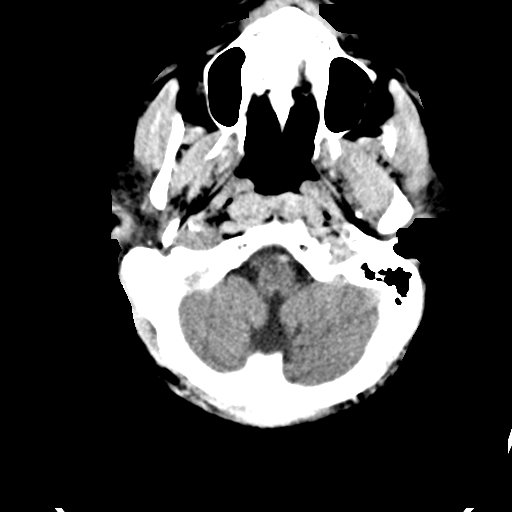
[im 10/37  brain]
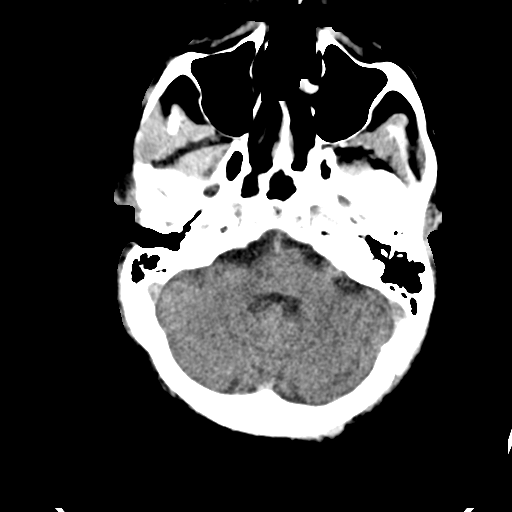
[im 13/37  brain]
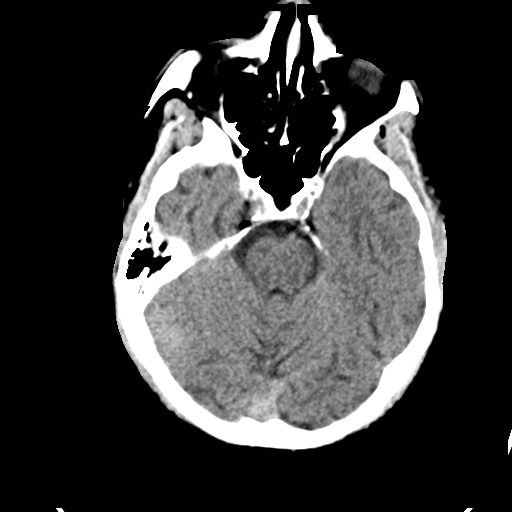
[im 17/37  brain]
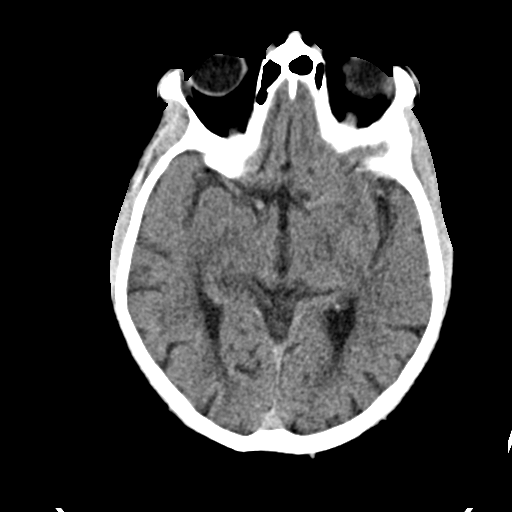
[im 17/37  bone]
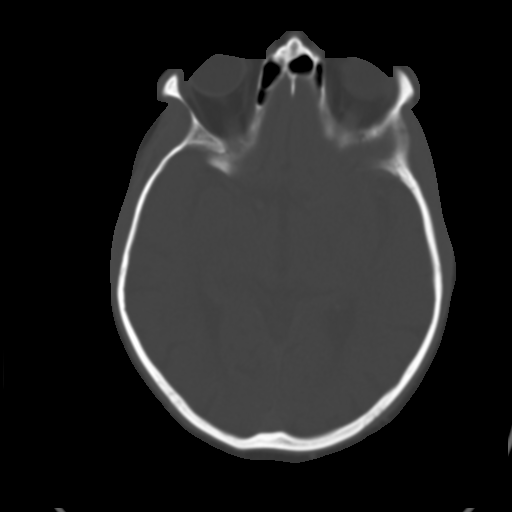
[im 20/37  brain]
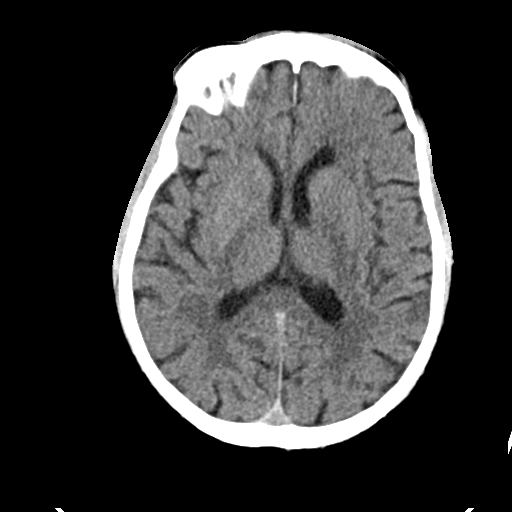
[im 24/37  brain]
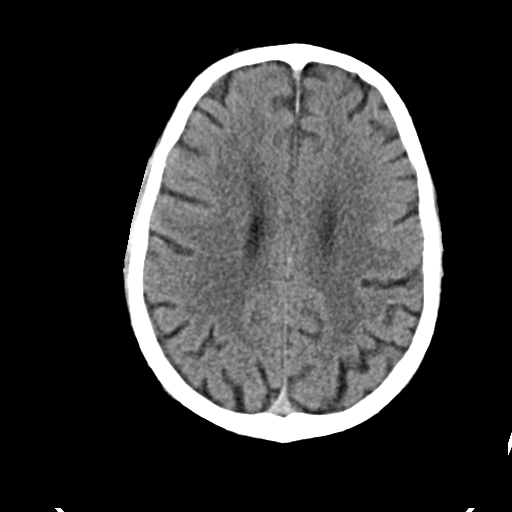
[im 28/37  brain]
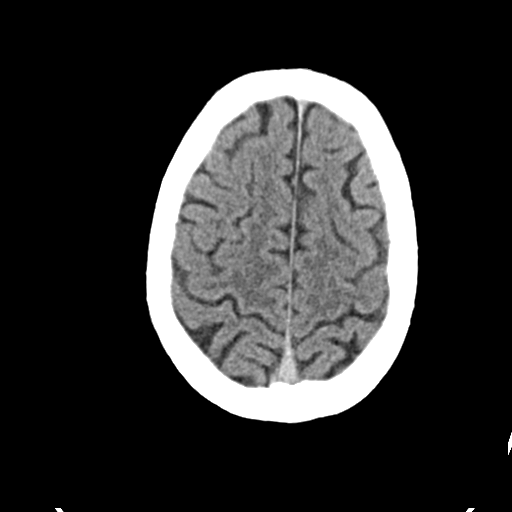
[im 30/37  brain]
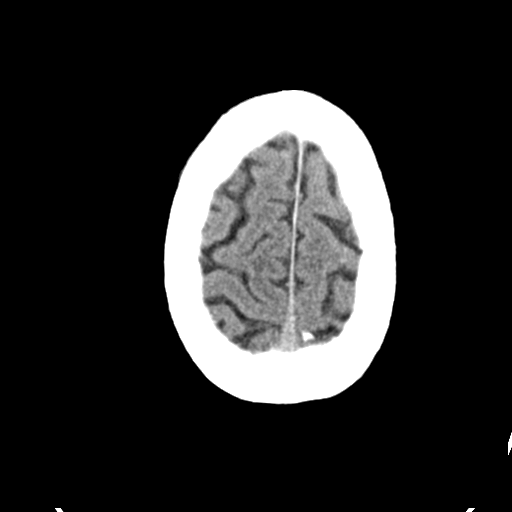
[im 30/37  bone]
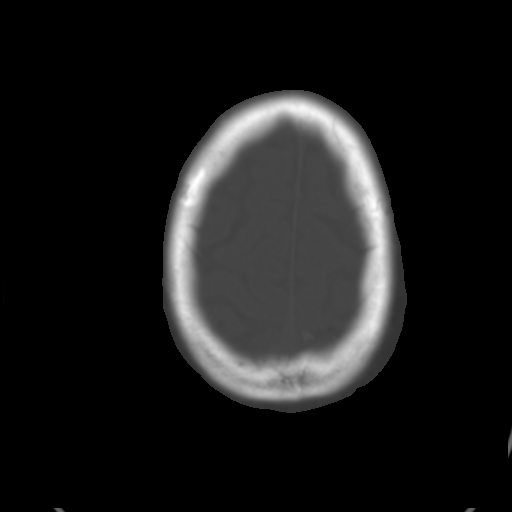
[im 34/37  brain]
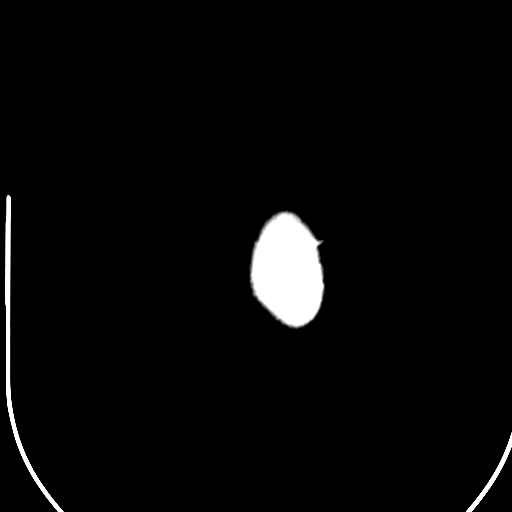

[Series 4: coronal soft · coronal · 0.37mm/px · 3 of 72 slices shown]
[im 24/72  brain]
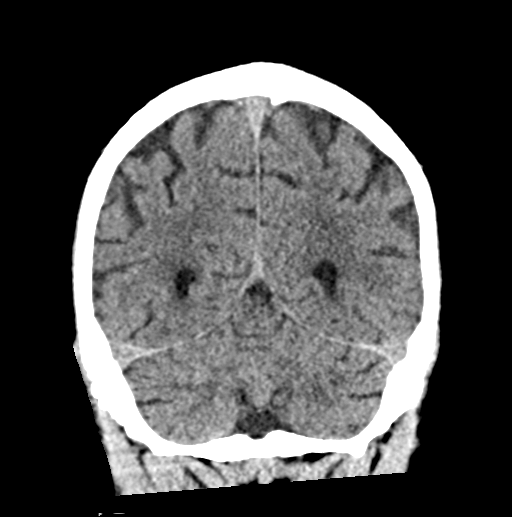
[im 32/72  brain]
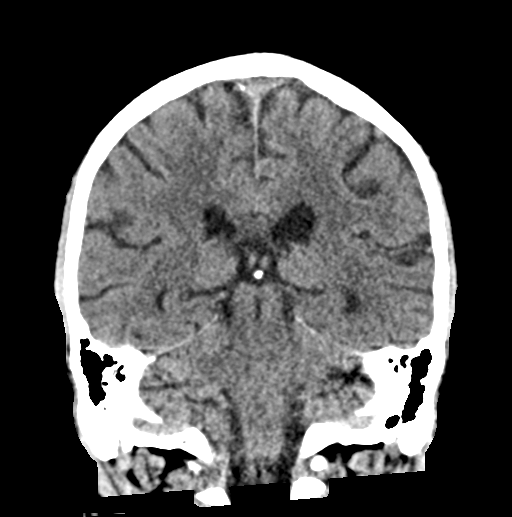
[im 40/72  brain]
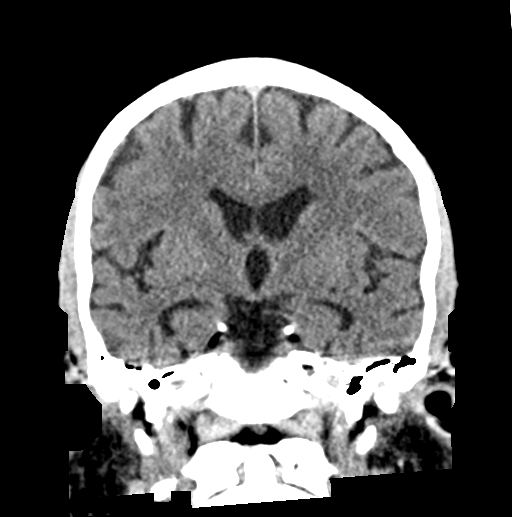

[Series 5: sag soft · sagittal · 0.37mm/px · 3 of 61 slices shown]
[im 21/61  brain]
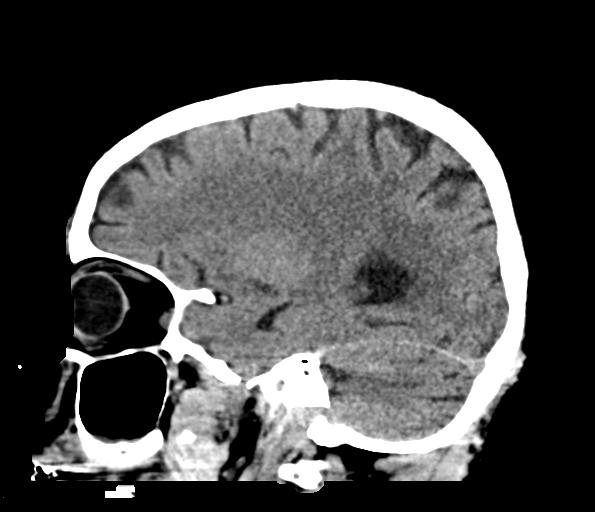
[im 31/61  brain]
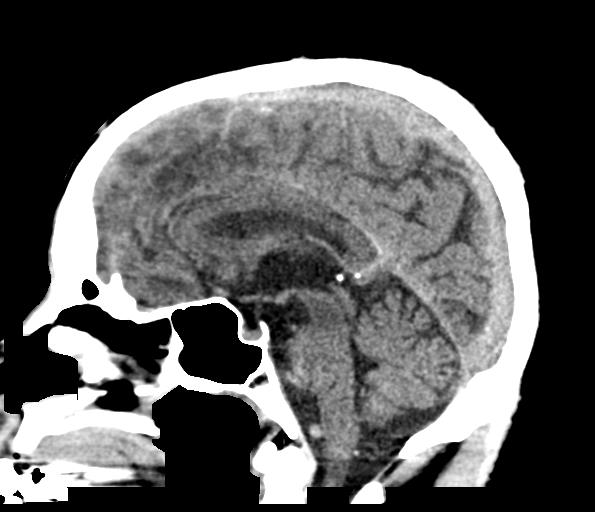
[im 41/61  brain]
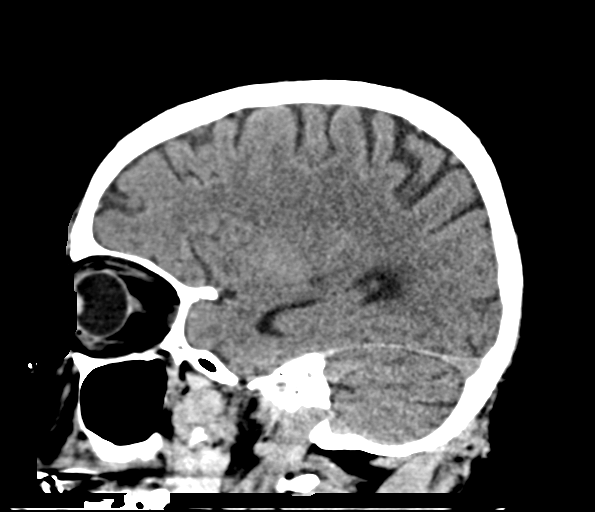

[16 of 47 positions shown; findings below may reference images not displayed]

FINDINGS: Brain: There is no mass, hemorrhage or extra-axial collection. The
size and configuration of the ventricles and extra-axial CSF spaces
are normal. There is hypoattenuation of the white matter, most
commonly indicating chronic small vessel disease.

Vascular: No abnormal hyperdensity of the major intracranial
arteries or dural venous sinuses. No intracranial atherosclerosis.

Skull: The visualized skull base, calvarium and extracranial soft
tissues are normal.

Sinuses/Orbits: No fluid levels or advanced mucosal thickening of
the visualized paranasal sinuses. No mastoid or middle ear effusion.
The orbits are normal.
IMPRESSION: Chronic small vessel disease without acute intracranial abnormality.

## 2020-09-12 SURGERY — CORONARY/GRAFT ACUTE MI REVASCULARIZATION
Anesthesia: LOCAL

## 2020-09-12 MED ORDER — SODIUM CHLORIDE 0.9 % IV BOLUS
1000.0000 mL | Freq: Once | INTRAVENOUS | Status: AC
  Administered 2020-09-12: 1000 mL via INTRAVENOUS

## 2020-09-12 MED ORDER — HEPARIN SODIUM (PORCINE) 5000 UNIT/ML IJ SOLN
4000.0000 [IU] | Freq: Once | INTRAMUSCULAR | Status: AC
  Administered 2020-09-12: 4000 [IU] via INTRAVENOUS
  Filled 2020-09-12: qty 1

## 2020-09-12 MED ORDER — ASPIRIN 81 MG PO CHEW
324.0000 mg | CHEWABLE_TABLET | Freq: Once | ORAL | Status: AC
  Administered 2020-09-12: 324 mg via ORAL
  Filled 2020-09-12: qty 4

## 2020-09-12 MED ORDER — METOPROLOL TARTRATE 25 MG PO TABS
25.0000 mg | ORAL_TABLET | Freq: Once | ORAL | Status: AC
  Administered 2020-09-12: 25 mg via ORAL
  Filled 2020-09-12: qty 1

## 2020-09-12 MED ORDER — MECLIZINE HCL 25 MG PO TABS
25.0000 mg | ORAL_TABLET | Freq: Once | ORAL | Status: AC
  Administered 2020-09-12: 25 mg via ORAL
  Filled 2020-09-12: qty 1

## 2020-09-12 MED ORDER — SODIUM CHLORIDE 0.9 % IV SOLN: INTRAVENOUS | Status: DC

## 2020-09-12 MED ORDER — ONDANSETRON HCL 4 MG/2ML IJ SOLN
4.0000 mg | Freq: Once | INTRAMUSCULAR | Status: AC
  Administered 2020-09-12: 4 mg via INTRAVENOUS
  Filled 2020-09-12: qty 2

## 2020-09-12 NOTE — ED Triage Notes (Signed)
Dizziness and diarrhea x 1 week.  Pt arrives actively vomiting since just PTA without relief.  Denies any CP/SOB or blood in his stools

## 2020-09-12 NOTE — Consult Note (Signed)
Responded to page, pt unavailable, no family present, staff will page again if further chaplain services needed.  Rev. Timmi Devora Chaplain 

## 2020-09-12 NOTE — ED Notes (Signed)
[facility] called for transport. [facility] does not have available truck at this time.

## 2020-09-12 NOTE — ED Notes (Addendum)
Pt here from [facility] for STEMI. A&Ox4. Currently still not endorsing any chest pain.

## 2020-09-12 NOTE — ED Provider Notes (Signed)
[facility] EMERGENCY DEPARTMENT Provider Note   CSN: [id number] Arrival date & time: 09/12/20  1802     History Chief Complaint  Patient presents with  . Vomiting    Bryan Rios is a 70 y.o. male past ministry of diabetes, hypertension, renal disorder (history of renal transplant) who presents for evaluation of dizziness, nausea/vomiting.  He states he has had this intermittently for the last couple weeks.  He states it will resolve and then he will be fine.  He reports that today, he when he woke up, he felt a little off but states that he was able to go about his normal business.  He went to eat with a friend and after he ate, he started feeling dizzy.  He denies any room spinning sensation.  He states it feels more like a lightheadedness, feeling off balance, feeling like something was off.  He then had an episode of vomiting.  He reports he has had some intermittent diarrhea.  No abdominal pain.  He reports that he was in his normal state of health yesterday.  He states right now, he feels slightly nauseous but otherwise denies any complaints.  Reports a few days ago he had some chest pain but states it resolved on its own. Denies any chest pain currently, difficulty breathing, abdominal paiin, dysuria, hematuria.  The history is provided by the patient.       Past Medical History:  Diagnosis Date  . Diabetes mellitus without complication ([place])   . Hypertension   . Renal disorder     There are no problems to display for this patient.   Past Surgical History:  Procedure Laterality Date  . KIDNEY TRANSPLANT         Family History  Problem Relation Age of Onset  . Diabetes Mellitus I Mother   . Diabetes Mellitus I Father     Social History   Tobacco Use  . Smoking status: Former Research scientist (life sciences)  . Smokeless tobacco: Never Used  Vaping Use  . Vaping Use: Never used  Substance Use Topics  . Alcohol use: Not Currently  . Drug use: Not Currently    Home  Medications Prior to Admission medications   Medication Sig Start Date End Date Taking? Authorizing Provider  aspirin EC 81 MG tablet Take 81 mg by mouth daily.   Yes [provider]  ergocalciferol (VITAMIN D2) 1.25 MG (50000 UT) capsule TK ONE C PO WEEKLY 01/17/12  Yes [provider]  insulin aspart (NOVOLOG) 100 UNIT/ML injection Inject into the skin. 10/17/15  Yes [provider]  insulin glargine (LANTUS) 100 UNIT/ML injection Inject into the skin. 10/24/15  Yes [provider]  irbesartan (AVAPRO) 300 MG tablet  11/11/18  Yes [provider]  mycophenolate (MYFORTIC) 180 MG EC tablet Take 360 mg by mouth 2 (two) times daily. 03/03/19  Yes [provider]  tacrolimus (PROGRAF) 0.5 MG capsule TK 3 CS PO QAM AND 2 CS QPM 03/11/19  Yes [provider]  cyclobenzaprine (FLEXERIL) 10 MG tablet Take 1 tablet (10 mg total) by mouth 2 (two) times daily as needed for muscle spasms. 11/12/19   Truddie Hidden, MD  simvastatin (ZOCOR) 40 MG tablet  11/27/18   [provider]    Allergies    Lisinopril, Metformin, Terbinafine hcl, Amlodipine, Nifedipine, and Diltiazem hcl  Review of Systems   Review of Systems  Constitutional: Negative for fever.  Respiratory: Negative for cough and shortness of breath.  Cardiovascular: Negative for chest pain.  Gastrointestinal: Positive for nausea and vomiting. Negative for abdominal pain.  Genitourinary: Negative for dysuria and hematuria.  Neurological: Positive for dizziness and light-headedness. Negative for headaches.  All other systems reviewed and are negative.   Physical Exam Updated Vital Signs BP (!) 178/86   Pulse 65   Temp (!) 97.3 F (36.3 C) (Oral)   Resp 15   Ht 5\' 11"  (1.803 m)   Wt 88 kg   SpO2 100%   BMI 27.06 kg/m   Physical Exam Vitals and nursing note reviewed.  Constitutional:      Appearance: Normal appearance. He is well-developed.  HENT:     Head:  Normocephalic and atraumatic.  Eyes:     General: Lids are normal.     Conjunctiva/sclera: Conjunctivae normal.     Pupils: Pupils are equal, round, and reactive to light.     Comments: Horizontal nystagmus noted to left.  No vertical or rotational nystagmus.  Cardiovascular:     Rate and Rhythm: Normal rate and regular rhythm.     Pulses: Normal pulses.          Radial pulses are 2+ on the right side and 2+ on the left side.       Dorsalis pedis pulses are 2+ on the right side and 2+ on the left side.     Heart sounds: Normal heart sounds. No murmur heard. No friction rub. No gallop.   Pulmonary:     Effort: Pulmonary effort is normal.     Breath sounds: Normal breath sounds.     Comments: Lungs clear to auscultation bilaterally.  Symmetric chest rise.  No wheezing, rales, rhonchi. Abdominal:     Palpations: Abdomen is soft. Abdomen is not rigid.     Tenderness: There is no abdominal tenderness. There is no guarding.     Comments: Abdomen is soft, non-distended, non-tender. No rigidity, No guarding. No peritoneal signs.  Musculoskeletal:        General: Normal range of motion.     Cervical back: Full passive range of motion without pain.  Skin:    General: Skin is warm and dry.     Capillary Refill: Capillary refill takes less than 2 seconds.  Neurological:     Mental Status: He is alert and oriented to person, place, and time.     Comments: Cranial nerves III-XII intact Follows commands, Moves all extremities  5/5 strength to BUE and BLE  Sensation intact throughout all major nerve distributions No gait abnormalities No slurred speech. No facial droop.   Psychiatric:        Speech: Speech normal.     ED Results / Procedures / Treatments   Labs (all labs ordered are listed, but only abnormal results are displayed) Labs Reviewed  COMPREHENSIVE METABOLIC PANEL - Abnormal; Notable for the following components:      Result Value   Glucose, Bld 140 (*)    BUN 25 (*)     Creatinine, Ser 1.68 (*)    GFR, Estimated 44 (*)    All other components within normal limits  URINALYSIS, ROUTINE W REFLEX MICROSCOPIC - Abnormal; Notable for the following components:   Protein, ur 30 (*)    All other components within normal limits  URINALYSIS, MICROSCOPIC (REFLEX) - Abnormal; Notable for the following components:   Bacteria, UA RARE (*)    All other components within normal limits  CBG MONITORING, ED - Abnormal; Notable for the following components:  Glucose-Capillary 128 (*)    All other components within normal limits  RESP PANEL BY RT-PCR (FLU A&B, COVID) ARPGX2  LIPASE, BLOOD  CBC  PROTIME-INR  APTT  HEMOGLOBIN A1C  LIPID PANEL  LACTIC ACID, PLASMA  LACTIC ACID, PLASMA  TROPONIN I (HIGH SENSITIVITY)  TROPONIN I (HIGH SENSITIVITY)  TROPONIN I (HIGH SENSITIVITY)  TROPONIN I (HIGH SENSITIVITY)  TROPONIN I (HIGH SENSITIVITY)    EKG EKG Interpretation  Date/Time:  Monday Sep 12 2020 23:38:03 EDT Ventricular Rate:  59 PR Interval:  122 QRS Duration: 129 QT Interval:  486 QTC Calculation: 482 R Axis:   -59 Text Interpretation: Sinus rhythm LVH with IVCD, LAD and secondary repol abnrm Borderline prolonged QT interval STelevation resolved Confirmed by Ezequiel Essex [phone]) on 09/12/2020 11:50:16 PM   Radiology CT Head Wo Contrast  Result Date: 09/12/2020 CLINICAL DATA:  Dizziness EXAM: CT HEAD WITHOUT CONTRAST TECHNIQUE: Contiguous axial images were obtained from the base of the skull through the vertex without intravenous contrast. COMPARISON:  None. FINDINGS: Brain: There is no mass, hemorrhage or extra-axial collection. The size and configuration of the ventricles and extra-axial CSF spaces are normal. There is hypoattenuation of the white matter, most commonly indicating chronic small vessel disease. Vascular: No abnormal hyperdensity of the major intracranial arteries or dural venous sinuses. No intracranial atherosclerosis. Skull: The visualized  skull base, calvarium and extracranial soft tissues are normal. Sinuses/Orbits: No fluid levels or advanced mucosal thickening of the visualized paranasal sinuses. No mastoid or middle ear effusion. The orbits are normal. IMPRESSION: Chronic small vessel disease without acute intracranial abnormality. Electronically Signed   By: Ulyses Jarred M.D.   On: 09/12/2020 22:07    Procedures Procedures   Medications Ordered in ED Medications  0.9 %  sodium chloride infusion ( Intravenous New Bag/Given 09/12/20 2259)  0.9 %  sodium chloride infusion (has no administration in time range)  ondansetron (ZOFRAN) injection 4 mg (4 mg Intravenous Given 09/12/20 2130)  sodium chloride 0.9 % bolus 1,000 mL (0 mLs Intravenous Stopped 09/12/20 2258)  meclizine (ANTIVERT) tablet 25 mg (25 mg Oral Given 09/12/20 2149)  aspirin chewable tablet 324 mg (324 mg Oral Given 09/12/20 2253)  heparin injection 4,000 Units (4,000 Units Intravenous Given 09/12/20 2253)  metoprolol tartrate (LOPRESSOR) tablet 25 mg (25 mg Oral Given 09/12/20 2259)    ED Course  I have reviewed the triage vital signs and the nursing notes.  Pertinent labs & imaging results that were available during my care of the patient were reviewed by me and considered in my medical decision making (see chart for details).    MDM Rules/Calculators/A&P                          70 year old male past ministry of hypertension, diabetes who presents for evaluation of lightheadedness, nausea/vomiting that began today.  He states he has had intermittent episodes of this over the last few weeks.  He currently denies any chest pain, difficulty breathing.  He states he has not any abdominal pain, fevers.  He states nobody else in the house has been sick.  On initial arrival, he is slightly bradycardic, hypertensive.  He has equal pulses in all 4 extremities.  He has does have some horizontal nystagmus that there may be some vertiginous component to it.  No neurodeficit  noted.  Unclear etiology of his symptoms at this time but will obtain an EKG given that he is hypertensive, diabetic with risk  factors.  He may have some atypical presentations given his history of diabetes and states that he did have some chest pain few days ago.  He primarily reports some nausea/vomiting/lightheadedness sensation today.  Labs ordered at triage.  CBG 128, lipase is normal.  CBC shows no leukocytosis.  Hemoglobin stable.  CMP shows BUN of 25, creatinine 1.60.  Anion gap is 8.   EKG appeared abnormal.  Looks like he has new changes inferior laterally both in ST changes as well as T wave inversions.  This appears new from previous EKG done in 2020.  Will consult cardiology.  Discussed patient with Dr. Doylene Canard ([facility]) who reviewed patient's EKG.  Given his age, history, concerning changes on EKG, he recommends calling this a code STEMI.  Discussed patient with Dr. Virgina Jock ([facility] STEMI doctor).  He agrees with plan and transfer to [facility].  He recommends giving patient 25 metoprolol.  Discussed patient with Dr. Reather Converse [facility] ER) who accepts transfer.  Updated patient on plan.  We will plan to transfer to [facility] for evaluation.  Portions of this note were generated with [employer]. Dictation errors may occur despite best attempts at proofreading.   Final Clinical Impression(s) / ED Diagnoses Final diagnoses:  Dizziness  Non-intractable vomiting with nausea, unspecified vomiting type    Rx / DC Orders ED Discharge Orders    None       Desma Mcgregor 09/13/20 0057    Ezequiel Essex, MD 09/13/20 [phone]

## 2020-09-12 NOTE — ED Provider Notes (Signed)
Patient transferred from [facility] with concern for possible STEMI.  He was seen for dizziness, diarrhea and nausea and vomiting.  States been having loose stools for the past 3 to 4 days have been nonbloody.  Today had 1 episode of vomiting after eating.  Feels lightheaded and dizzy but denies any room spinning dizziness. Had chest pain several days ago that resolved after few minutes and none since.  EKG at [facility] showed ST elevation in aVR and aVL with depressions inferiorly and laterally. Cardiology was contacted and STEMI was called.  On arrival here ST changes have resolved.  Patient denies any chest pain.  Seen at bedside with Dr. Virgina Jock of cardiology service who has canceled code STEMI at this time.  Troponins remain negative.  Lactate is negative.  Creatinine is 1.68 which patient states is near his baseline.  Abdominal CT shows no acute pathology. With his vomiting and dizziness, will obtain brain MRI.  Unclear etiology of patient's vomiting and diarrhea.  He is not having any chest pain.  His EKG changes have resolved.  As per plan with Dr. Virgina Jock.  We will plan for admission and possible stress test in the morning. D/w Dr. Marlowe Sax.    Ezequiel Essex, MD 09/13/20 [phone]

## 2020-09-12 NOTE — ED Notes (Signed)
Patient transported to CT 

## 2020-09-13 ENCOUNTER — Inpatient Hospital Stay (HOSPITAL_COMMUNITY): Payer: HMO

## 2020-09-13 ENCOUNTER — Emergency Department (HOSPITAL_COMMUNITY): Payer: HMO

## 2020-09-13 ENCOUNTER — Encounter (HOSPITAL_COMMUNITY): Payer: Self-pay | Admitting: Cardiology

## 2020-09-13 DIAGNOSIS — R1110 Vomiting, unspecified: Secondary | ICD-10-CM

## 2020-09-13 DIAGNOSIS — Z87891 Personal history of nicotine dependence: Secondary | ICD-10-CM | POA: Diagnosis not present

## 2020-09-13 DIAGNOSIS — R112 Nausea with vomiting, unspecified: Secondary | ICD-10-CM | POA: Diagnosis not present

## 2020-09-13 DIAGNOSIS — A059 Bacterial foodborne intoxication, unspecified: Secondary | ICD-10-CM | POA: Diagnosis present

## 2020-09-13 DIAGNOSIS — R001 Bradycardia, unspecified: Secondary | ICD-10-CM | POA: Diagnosis present

## 2020-09-13 DIAGNOSIS — R42 Dizziness and giddiness: Secondary | ICD-10-CM | POA: Diagnosis present

## 2020-09-13 DIAGNOSIS — R197 Diarrhea, unspecified: Secondary | ICD-10-CM | POA: Diagnosis not present

## 2020-09-13 DIAGNOSIS — N1832 Chronic kidney disease, stage 3b: Secondary | ICD-10-CM | POA: Diagnosis present

## 2020-09-13 DIAGNOSIS — Z833 Family history of diabetes mellitus: Secondary | ICD-10-CM | POA: Diagnosis not present

## 2020-09-13 DIAGNOSIS — R9431 Abnormal electrocardiogram [ECG] [EKG]: Secondary | ICD-10-CM | POA: Diagnosis present

## 2020-09-13 DIAGNOSIS — E1122 Type 2 diabetes mellitus with diabetic chronic kidney disease: Secondary | ICD-10-CM | POA: Diagnosis present

## 2020-09-13 DIAGNOSIS — Z940 Kidney transplant status: Secondary | ICD-10-CM | POA: Diagnosis not present

## 2020-09-13 DIAGNOSIS — Z79899 Other long term (current) drug therapy: Secondary | ICD-10-CM | POA: Diagnosis not present

## 2020-09-13 DIAGNOSIS — E785 Hyperlipidemia, unspecified: Secondary | ICD-10-CM | POA: Diagnosis present

## 2020-09-13 DIAGNOSIS — Z794 Long term (current) use of insulin: Secondary | ICD-10-CM | POA: Diagnosis not present

## 2020-09-13 DIAGNOSIS — R0789 Other chest pain: Secondary | ICD-10-CM | POA: Diagnosis present

## 2020-09-13 DIAGNOSIS — Z888 Allergy status to other drugs, medicaments and biological substances status: Secondary | ICD-10-CM | POA: Diagnosis not present

## 2020-09-13 DIAGNOSIS — I129 Hypertensive chronic kidney disease with stage 1 through stage 4 chronic kidney disease, or unspecified chronic kidney disease: Secondary | ICD-10-CM | POA: Diagnosis present

## 2020-09-13 DIAGNOSIS — D84821 Immunodeficiency due to drugs: Secondary | ICD-10-CM | POA: Diagnosis present

## 2020-09-13 DIAGNOSIS — Z20822 Contact with and (suspected) exposure to covid-19: Secondary | ICD-10-CM | POA: Diagnosis present

## 2020-09-13 DIAGNOSIS — Z7982 Long term (current) use of aspirin: Secondary | ICD-10-CM | POA: Diagnosis not present

## 2020-09-13 LAB — LIPID PANEL
Cholesterol: 111 mg/dL (ref 0–200)
HDL: 41 mg/dL (ref >40)
LDL Cholesterol: 58 mg/dL (ref 0–99)
Total CHOL/HDL Ratio: 2.7 RATIO
Triglycerides: 59 mg/dL (ref <150)
VLDL: 12 mg/dL (ref 0–40)

## 2020-09-13 LAB — TROPONIN I (HIGH SENSITIVITY)
Troponin I (High Sensitivity): 9 ng/L (ref <18)
Troponin I (High Sensitivity): 11 ng/L (ref <18)
Troponin I (High Sensitivity): 9 ng/L (ref <18)

## 2020-09-13 LAB — GLUCOSE, CAPILLARY
Glucose-Capillary: 136 mg/dL — ABNORMAL HIGH (ref 70–99)
Glucose-Capillary: 113 mg/dL — ABNORMAL HIGH (ref 70–99)
Glucose-Capillary: 210 mg/dL — ABNORMAL HIGH (ref 70–99)

## 2020-09-13 LAB — HEMOGLOBIN A1C
Hgb A1c MFr Bld: 7.4 % — ABNORMAL HIGH (ref 4.8–5.6)
Mean Plasma Glucose: 165.68 mg/dL

## 2020-09-13 LAB — CBG MONITORING, ED
Glucose-Capillary: 115 mg/dL — ABNORMAL HIGH (ref 70–99)
Glucose-Capillary: 147 mg/dL — ABNORMAL HIGH (ref 70–99)

## 2020-09-13 LAB — HIV ANTIBODY (ROUTINE TESTING W REFLEX): HIV Screen 4th Generation wRfx: NONREACTIVE

## 2020-09-13 LAB — TSH: TSH: 1.608 u[IU]/mL (ref 0.350–4.500)

## 2020-09-13 LAB — LACTIC ACID, PLASMA: Lactic Acid, Venous: 1.7 mmol/L (ref 0.5–1.9)

## 2020-09-13 IMAGING — MR MR HEAD W/O CM
12 of 13 series · 44 of 48 positions shown · non-contrast
Comparison: Head CT [DATE].

CLINICAL DATA: 69-year-old male with possible STEMI. Dizziness.
Vomiting.

EXAM:
MRI HEAD WITHOUT CONTRAST
TECHNIQUE: Multiplanar, multiecho pulse sequences of the brain and surrounding
structures were obtained without intravenous contrast.

[Series 5: DWI · axial · 3.0mm · 0.88mm/px · z∈[-84,+66]mm · 8 of 102 slices shown (1 of 4)]
[im 1/102]
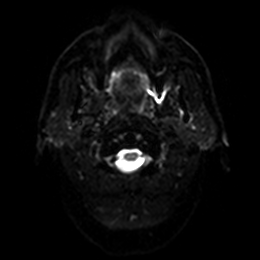
[im 15/102]
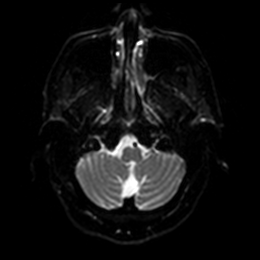
[im 29/102]
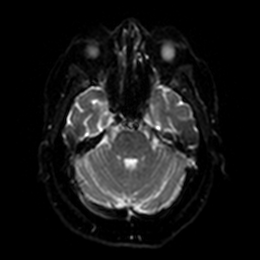
[im 44/102]
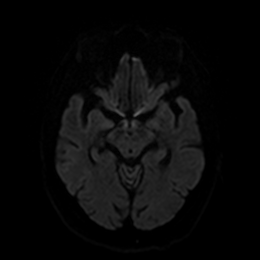
[im 58/102]
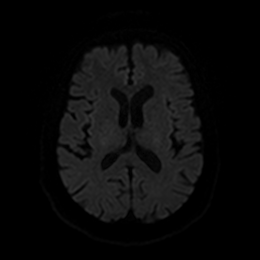
[im 73/102]
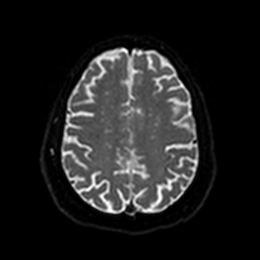
[im 87/102]
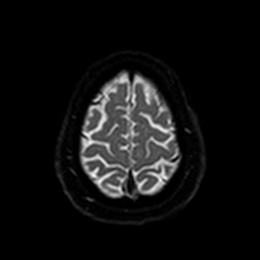
[im 102/102]
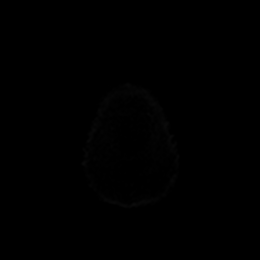

[Series 6: DWI · axial · 3.0mm · 0.88mm/px · z∈[-84,+66]mm · 4 of 51 slices shown (2 of 4)]
[im 1/51]
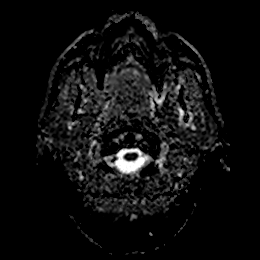
[im 17/51]
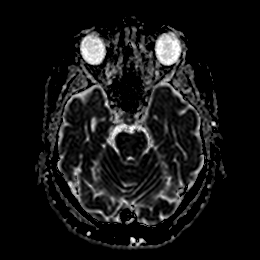
[im 34/51]
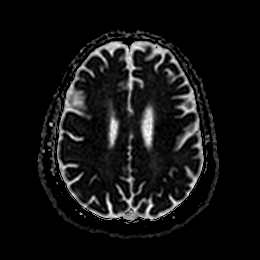
[im 51/51]
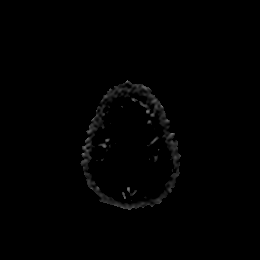

[Series 7: DWI · coronal · 4.0mm · 0.88mm/px · 6 of 72 slices shown (3 of 4)]
[im 1/72]
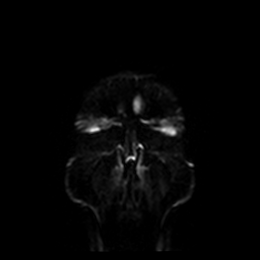
[im 15/72]
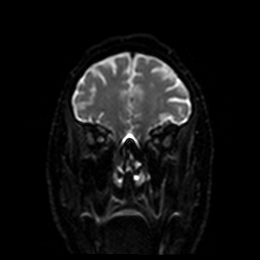
[im 29/72]
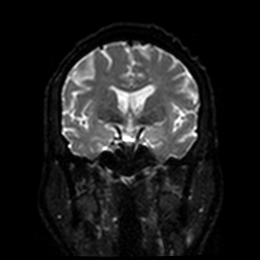
[im 43/72]
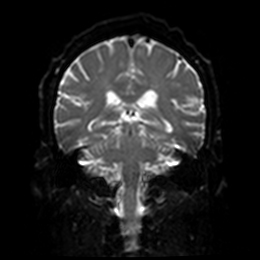
[im 57/72]
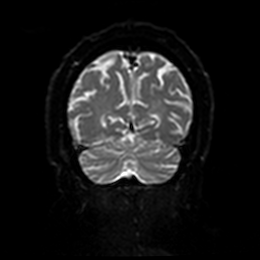
[im 72/72]
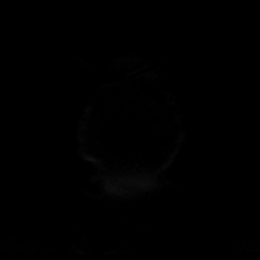

[Series 8: DWI · coronal · 4.0mm · 0.88mm/px · 3 of 36 slices shown (4 of 4)]
[im 1/36]
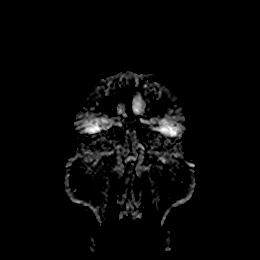
[im 18/36]
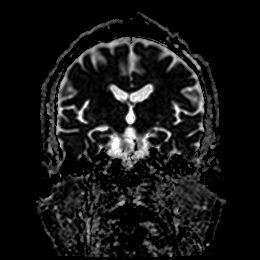
[im 36/36]
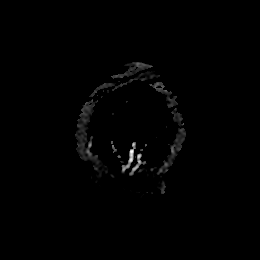

[Series 9: T1 · sagittal · 5.0mm · 0.75mm/px · 2 of 23 slices shown]
[im 1/23]
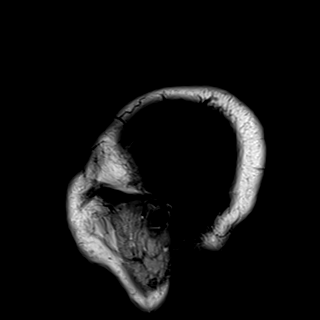
[im 23/23]
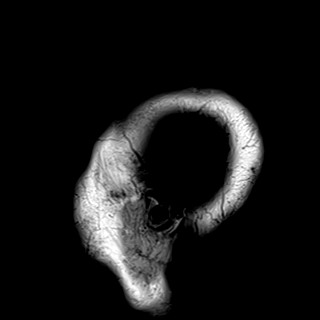

[Series 10: T2 · axial · 5.0mm · 0.72mm/px · z∈[-87,+69]mm · 2 of 27 slices shown (1 of 2)]
[im 1/27]
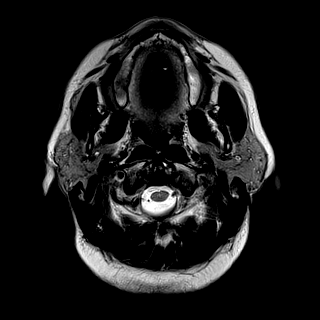
[im 27/27]
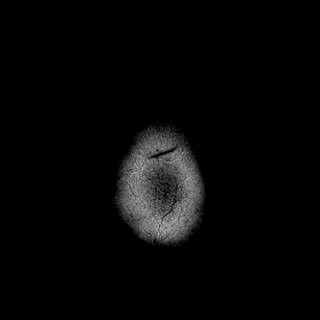

[Series 11: FLAIR · axial · 5.0mm · 0.45mm/px · z∈[-87,+69]mm · 2 of 27 slices shown]
[im 1/27]
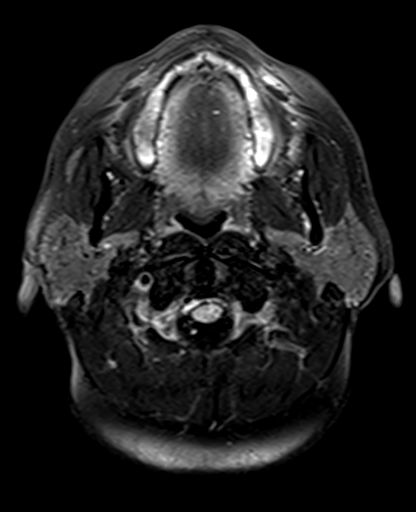
[im 27/27]
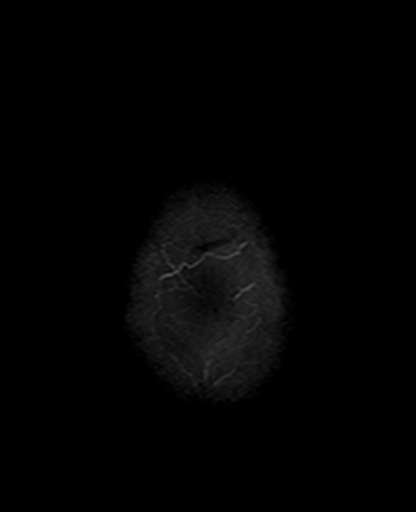

[Series 12: mag_images · axial · 3.0mm · 0.90mm/px · z∈[-85,+68]mm · 4 of 52 slices shown]
[im 1/52]
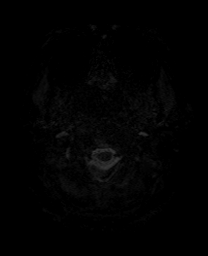
[im 18/52]
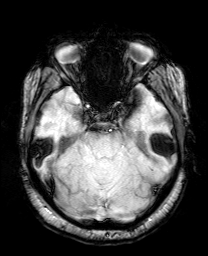
[im 35/52]
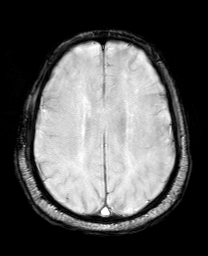
[im 52/52]
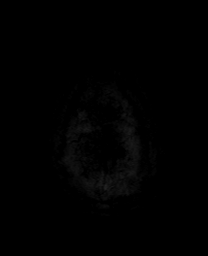

[Series 13: pha_images · axial · 3.0mm · 0.90mm/px · z∈[-85,+68]mm · 4 of 52 slices shown]
[im 1/52]
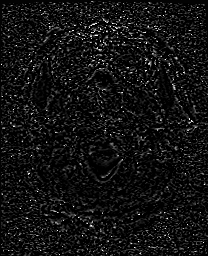
[im 18/52]
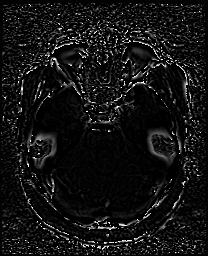
[im 35/52]
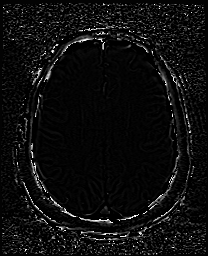
[im 52/52]
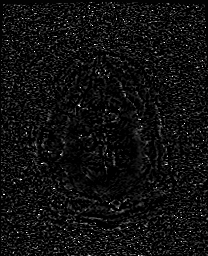

[Series 14: swi_images · axial · 3.0mm · 0.90mm/px · z∈[-85,+68]mm · 4 of 52 slices shown]
[im 1/52]
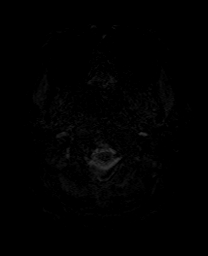
[im 18/52]
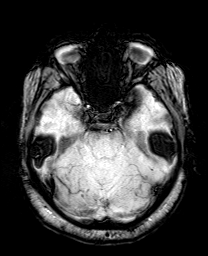
[im 35/52]
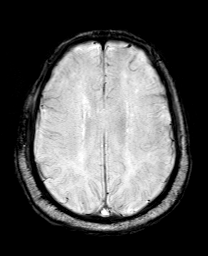
[im 52/52]
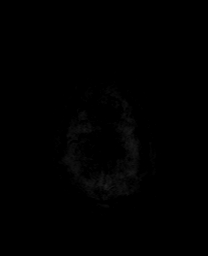

[Series 15: mip_images(sw) · axial · 24.0mm · 0.90mm/px · z∈[-75,+57]mm · 3 of 45 slices shown]
[im 1/45]
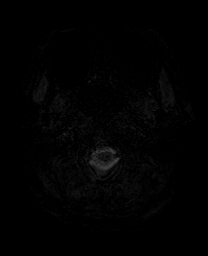
[im 23/45]
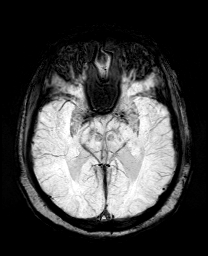
[im 45/45]
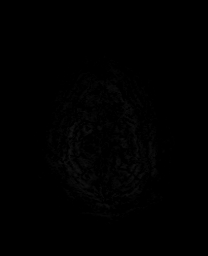

[Series 17: T2 · coronal · 5.0mm · 0.34mm/px · 2 of 29 slices shown (2 of 2)]
[im 1/29]
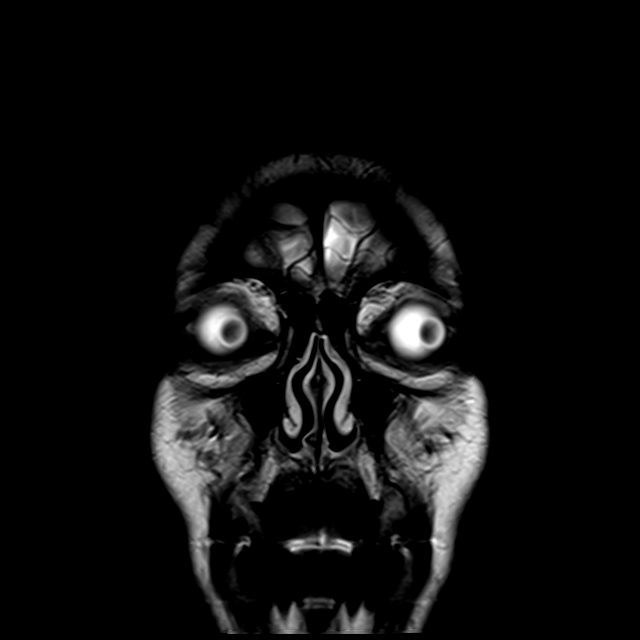
[im 29/29]
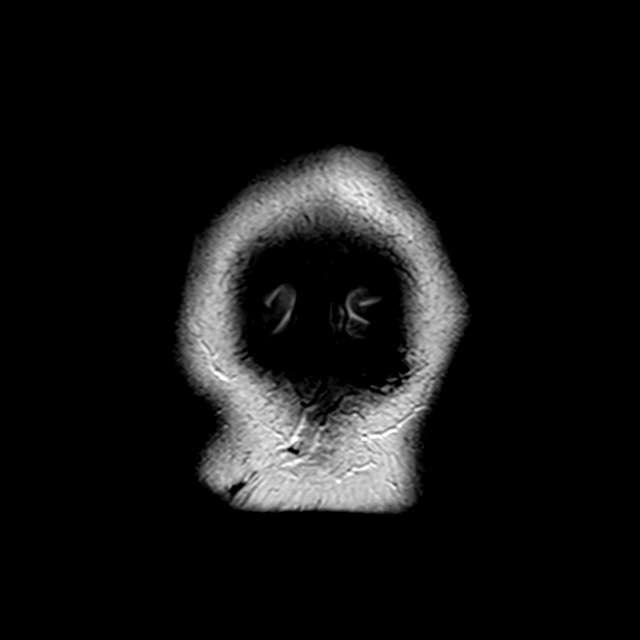

[44 of 48 positions shown; findings below may reference images not displayed]

FINDINGS: Brain: No restricted diffusion to suggest acute infarction. No
midline shift, mass effect, evidence of mass lesion,
ventriculomegaly, extra-axial collection or acute intracranial
hemorrhage. Cervicomedullary junction and pituitary are within
normal limits.

Patchy mild to moderate for age bilateral cerebral white matter T2
and FLAIR hyperintensity, mostly periventricular. No cortical
encephalomalacia identified. No convincing chronic cerebral blood
products. Deep gray matter nuclei, brainstem and cerebellum are
within normal limits for age.

Vascular: Major intracranial vascular flow voids are preserved.
Dominant right vertebral artery.

Skull and upper cervical spine: Chronic C3-C4 disc and endplate
degeneration. Normal background bone marrow signal.

Sinuses/Orbits: Negative orbits soft tissues. Trace paranasal sinus
mucosal thickening.

Other: Mastoid air cells are clear. Visible internal auditory
structures appear normal. Visible stylomastoid foramina and parotid
glands appear within normal limits.
IMPRESSION: 1. No acute intracranial abnormality.
2. Mild to moderate for age nonspecific cerebral white matter signal
changes, most commonly due to chronic small vessel disease.

## 2020-09-13 IMAGING — CT CT ABD-PELV W/O CM
2 of 7 series · 14 of 46 positions shown, 19 images · non-contrast
Comparison: None.

CLINICAL DATA: Dizziness and diarrhea x1 week

EXAM:
CT ABDOMEN AND PELVIS WITHOUT CONTRAST
TECHNIQUE: Multidetector CT imaging of the abdomen and pelvis was performed
following the standard protocol without IV contrast.

[Series 3: ap without · axial · non-contrast · 0.81mm/px · z∈[+859,+1249]mm · 11 of 90 slices shown, 16 images]
[im 6/90  soft-tissue]
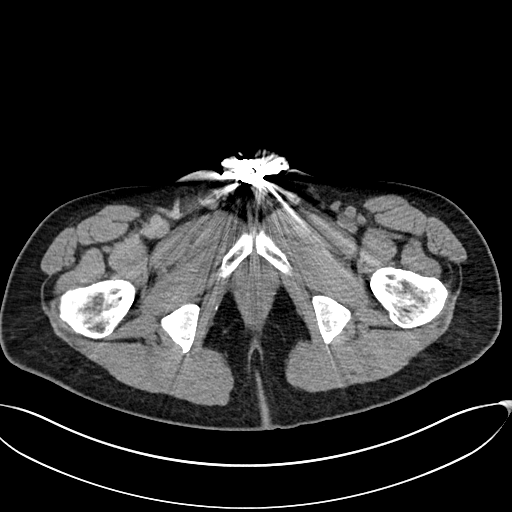
[im 6/90  bone]
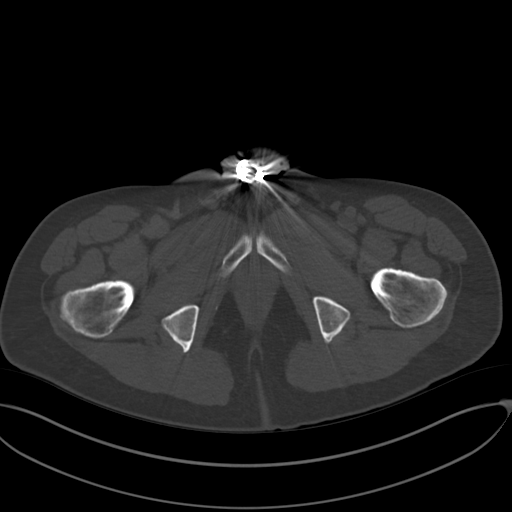
[im 17/90  soft-tissue]
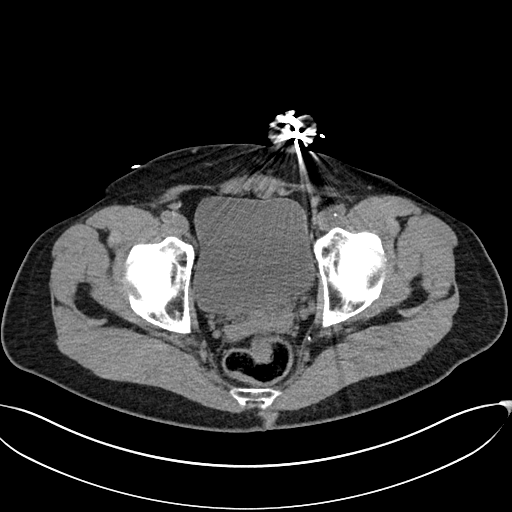
[im 23/90  soft-tissue]
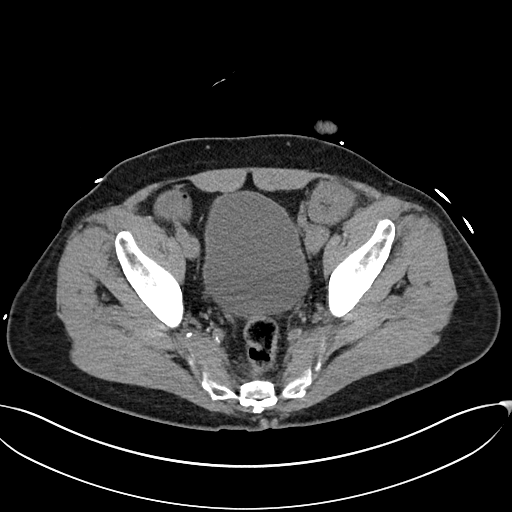
[im 34/90  soft-tissue]
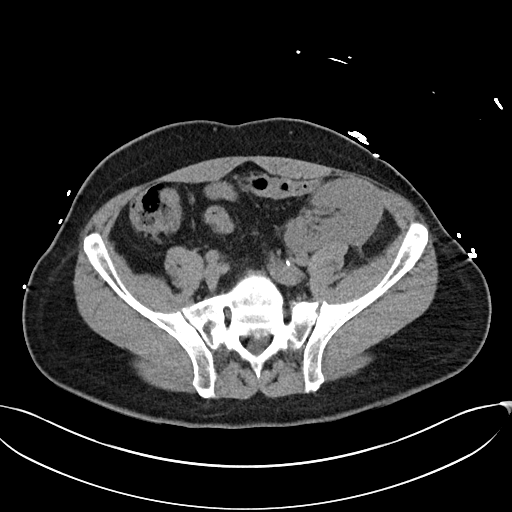
[im 39/90  soft-tissue]
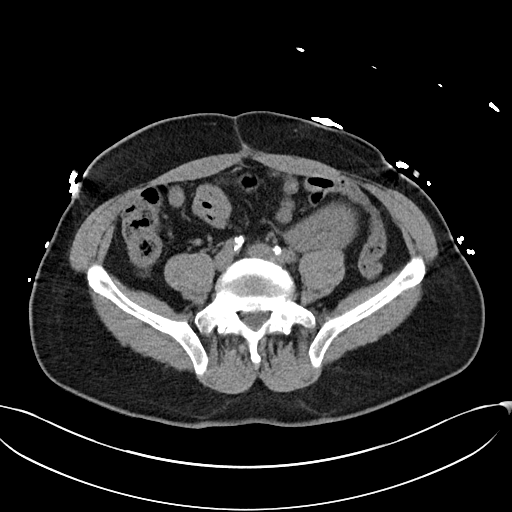
[im 51/90  soft-tissue]
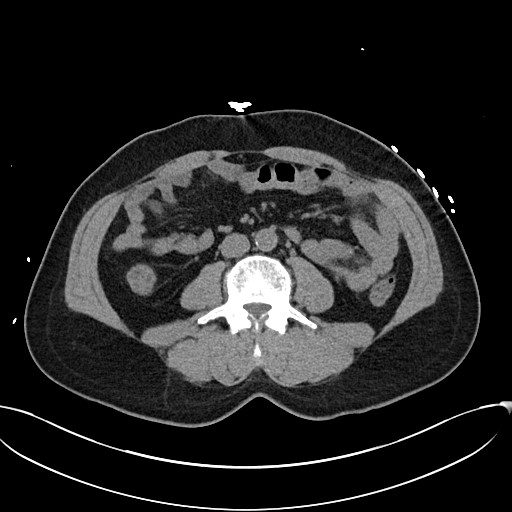
[im 56/90  soft-tissue]
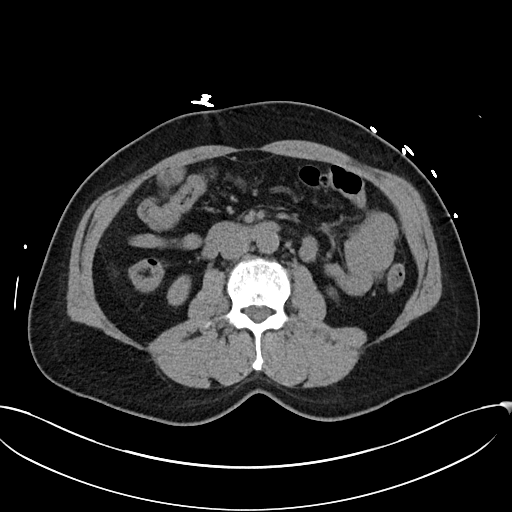
[im 67/90  soft-tissue]
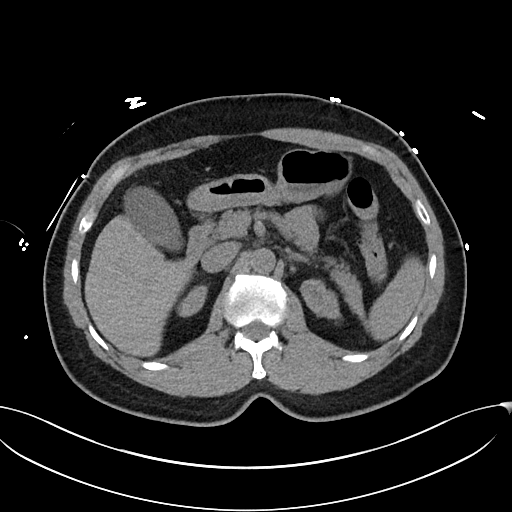
[im 67/90  lung]
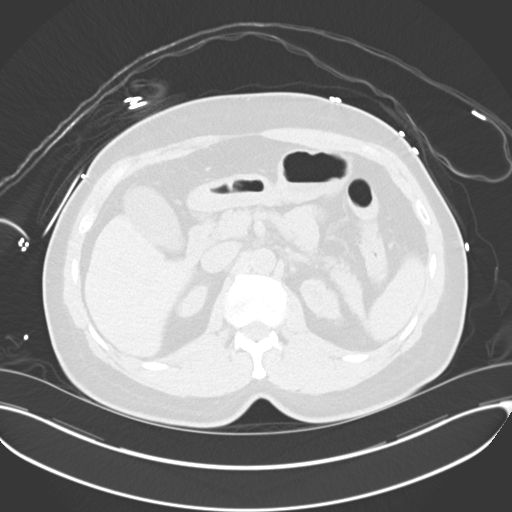
[im 73/90  soft-tissue]
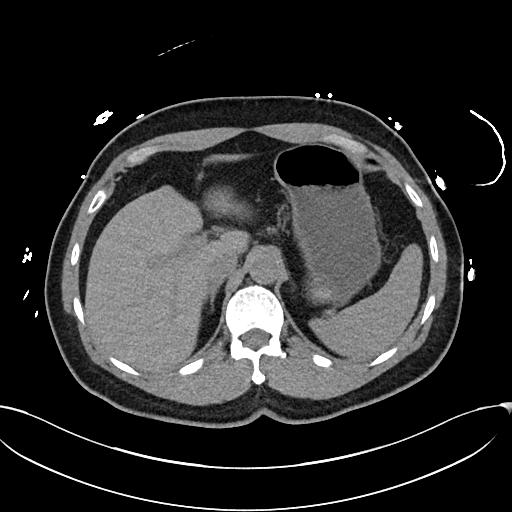
[im 73/90  lung]
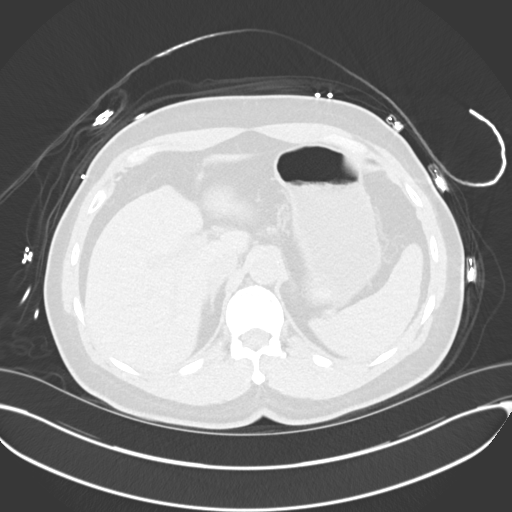
[im 73/90  bone]
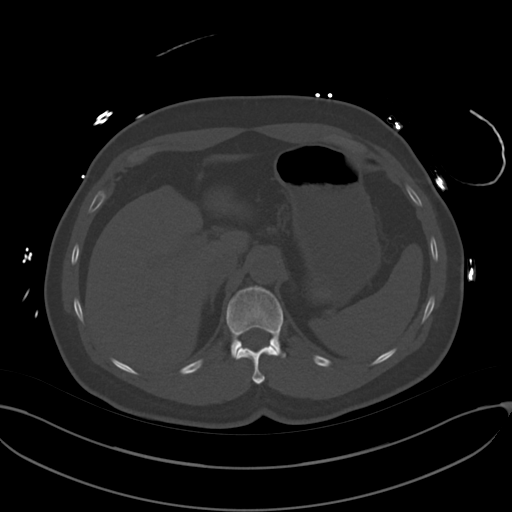
[im 78/90  lung]
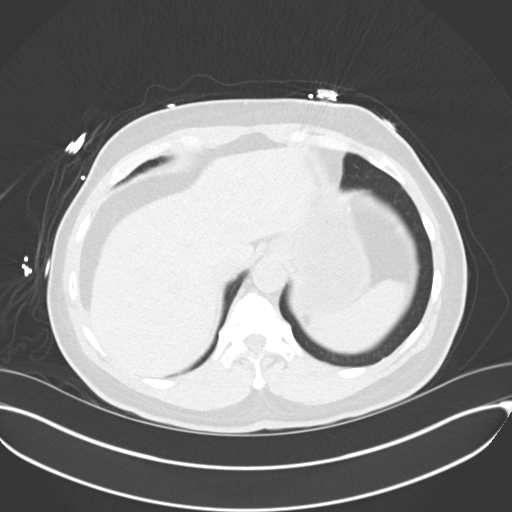
[im 84/90  soft-tissue]
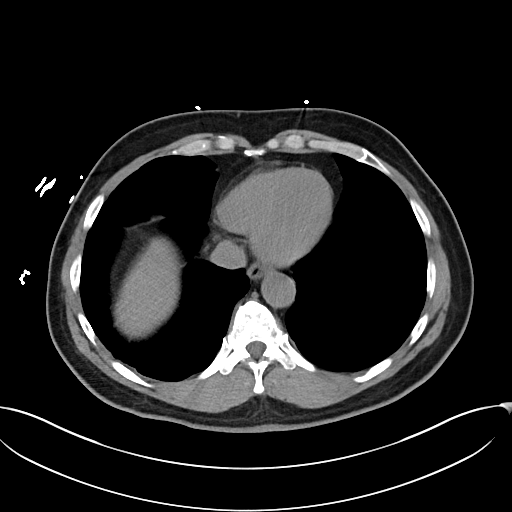
[im 84/90  lung]
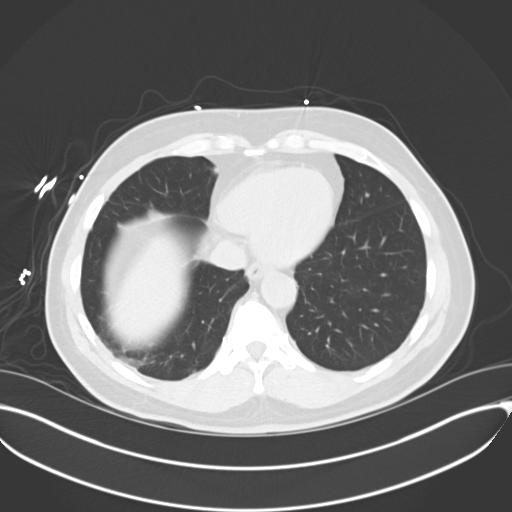

[Series 6: cor · coronal · 0.81mm/px · 3 of 103 slices shown]
[im 26/103  soft-tissue]
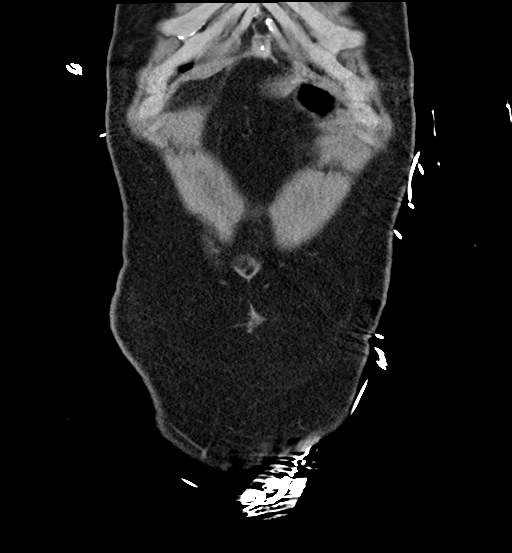
[im 52/103  soft-tissue]
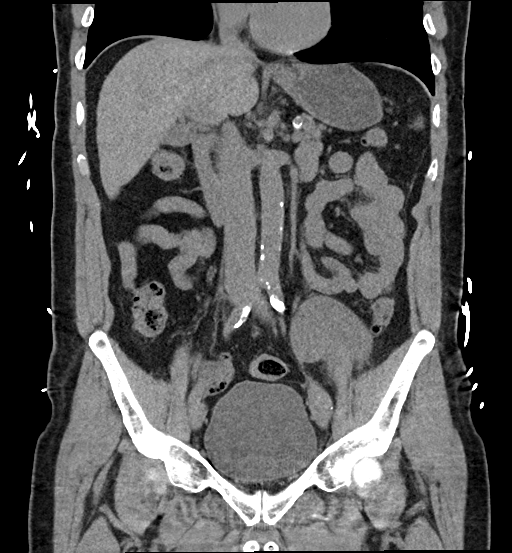
[im 77/103  soft-tissue]
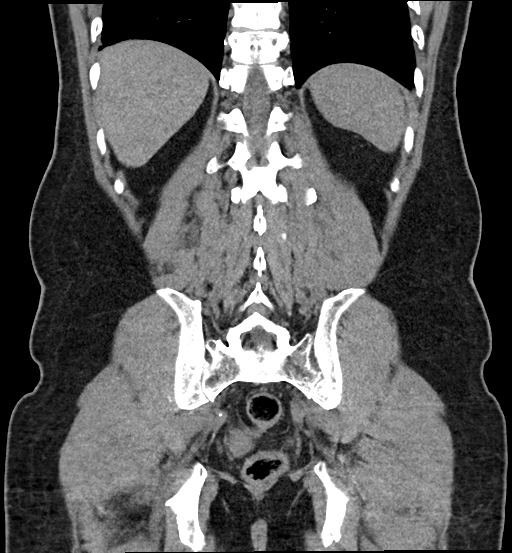

[14 of 46 positions shown; findings below may reference images not displayed]

FINDINGS: Lower chest: Bibasilar atelectasis. Normal size heart. No
significant pericardial effusion/thickening.

Hepatobiliary: Unremarkable noncontrast appearance of the hepatic
parenchyma. Gallbladder is mildly distended but otherwise
unremarkable. No biliary ductal dilation.

Pancreas: Within normal limits.

Spleen: Within normal limits.

Adrenals/Urinary Tract: Bilateral adrenal glands are unremarkable.
The bilateral native kidneys are atrophic. Left lower quadrant
transplant kidney without hydronephrosis. No renal, ureteral or
bladder calculi visualized. Urinary bladder is grossly unremarkable
for degree of distension.

Stomach/Bowel: Stomach is grossly unremarkable. No pathologic
dilation of small bowel. Normal appendix. Colonic diverticulosis
without findings of acute diverticulitis.

Vascular/Lymphatic: Aortic atherosclerosis. No enlarged abdominal or
pelvic lymph nodes.

Reproductive: Prostate is unremarkable.  Penile prosthesis.

Other: No abdominopelvic ascites or pneumoperitoneum

Musculoskeletal: No acute osseous abnormality
IMPRESSION: 1. No acute findings in the abdomen or pelvis.
2. Colonic diverticulosis without findings of acute diverticulitis.
3. Left lower quadrant transplant kidney without hydronephrosis.
4. Aortic atherosclerosis.

Aortic Atherosclerosis ([YC]-[YC]).

## 2020-09-13 MED ORDER — SODIUM CHLORIDE 0.9 % IV SOLN: INTRAVENOUS | Status: DC

## 2020-09-13 MED ORDER — REGADENOSON 0.4 MG/5ML IV SOLN
0.4000 mg | Freq: Once | INTRAVENOUS | Status: AC
  Administered 2020-09-13: 0.4 mg via INTRAVENOUS

## 2020-09-13 MED ORDER — INSULIN ASPART 100 UNIT/ML IJ SOLN
0.0000 [IU] | Freq: Every day | INTRAMUSCULAR | Status: DC
  Administered 2020-09-13: 2 [IU] via SUBCUTANEOUS

## 2020-09-13 MED ORDER — MYCOPHENOLATE SODIUM 180 MG PO TBEC
360.0000 mg | DELAYED_RELEASE_TABLET | Freq: Two times a day (BID) | ORAL | Status: DC
  Administered 2020-09-13 – 2020-09-14 (×3): 360 mg via ORAL
  Filled 2020-09-13 (×3): qty 2

## 2020-09-13 MED ORDER — INSULIN ASPART 100 UNIT/ML IJ SOLN
0.0000 [IU] | Freq: Three times a day (TID) | INTRAMUSCULAR | Status: DC
  Administered 2020-09-14: 1 [IU] via SUBCUTANEOUS

## 2020-09-13 MED ORDER — REGADENOSON 0.4 MG/5ML IV SOLN
INTRAVENOUS | Status: AC
  Filled 2020-09-13: qty 5

## 2020-09-13 MED ORDER — ACETAMINOPHEN 325 MG PO TABS
650.0000 mg | ORAL_TABLET | Freq: Four times a day (QID) | ORAL | Status: DC | PRN
  Administered 2020-09-14: 650 mg via ORAL
  Filled 2020-09-13: qty 2

## 2020-09-13 MED ORDER — ACETAMINOPHEN 650 MG RE SUPP: 650.0000 mg | Freq: Four times a day (QID) | RECTAL | Status: DC | PRN

## 2020-09-13 MED ORDER — TACROLIMUS 1 MG PO CAPS
1.0000 mg | ORAL_CAPSULE | Freq: Every day | ORAL | Status: DC
  Administered 2020-09-13: 1 mg via ORAL
  Filled 2020-09-13: qty 1

## 2020-09-13 MED ORDER — SODIUM CHLORIDE 0.9 % IV SOLN: 12.5000 mg | Freq: Four times a day (QID) | INTRAVENOUS | Status: DC | PRN

## 2020-09-13 MED ORDER — TACROLIMUS 0.5 MG PO CAPS: 0.5000 mg | ORAL_CAPSULE | ORAL | Status: DC

## 2020-09-13 MED ORDER — ASPIRIN EC 81 MG PO TBEC
81.0000 mg | DELAYED_RELEASE_TABLET | Freq: Every day | ORAL | Status: DC
  Administered 2020-09-13 – 2020-09-14 (×2): 81 mg via ORAL
  Filled 2020-09-13 (×2): qty 1

## 2020-09-13 MED ORDER — ENOXAPARIN SODIUM 40 MG/0.4ML IJ SOSY
40.0000 mg | PREFILLED_SYRINGE | INTRAMUSCULAR | Status: DC
  Administered 2020-09-13 – 2020-09-14 (×2): 40 mg via SUBCUTANEOUS
  Filled 2020-09-13 (×2): qty 0.4

## 2020-09-13 MED ORDER — TECHNETIUM TC 99M TETROFOSMIN IV KIT
10.9000 | PACK | Freq: Once | INTRAVENOUS | Status: AC | PRN
  Administered 2020-09-13: 10.9 via INTRAVENOUS

## 2020-09-13 MED ORDER — TECHNETIUM TC 99M TETROFOSMIN IV KIT
31.0000 | PACK | Freq: Once | INTRAVENOUS | Status: AC | PRN
  Administered 2020-09-13: 31 via INTRAVENOUS

## 2020-09-13 MED ORDER — TACROLIMUS 1 MG PO CAPS
1.5000 mg | ORAL_CAPSULE | Freq: Every day | ORAL | Status: DC
  Administered 2020-09-13 – 2020-09-14 (×2): 1.5 mg via ORAL
  Filled 2020-09-13 (×2): qty 1

## 2020-09-13 MED ORDER — IRBESARTAN 300 MG PO TABS
150.0000 mg | ORAL_TABLET | Freq: Two times a day (BID) | ORAL | Status: DC
  Administered 2020-09-13 – 2020-09-14 (×3): 150 mg via ORAL
  Filled 2020-09-13 (×4): qty 1

## 2020-09-13 MED ORDER — INSULIN GLARGINE 100 UNIT/ML ~~LOC~~ SOLN
15.0000 [IU] | Freq: Two times a day (BID) | SUBCUTANEOUS | Status: DC
  Administered 2020-09-13 – 2020-09-14 (×3): 15 [IU] via SUBCUTANEOUS
  Filled 2020-09-13 (×4): qty 0.15

## 2020-09-13 MED ORDER — ATORVASTATIN CALCIUM 40 MG PO TABS
40.0000 mg | ORAL_TABLET | Freq: Every day | ORAL | Status: DC
  Administered 2020-09-13: 40 mg via ORAL
  Filled 2020-09-13: qty 1

## 2020-09-13 MED ORDER — HYDRALAZINE HCL 20 MG/ML IJ SOLN: 5.0000 mg | INTRAMUSCULAR | Status: DC | PRN

## 2020-09-13 NOTE — Consult Note (Addendum)
CARDIOLOGY CONSULT NOTE  Patient ID: Bryan Rios MRN: [id number] DOB/AGE: 1950/08/12 70 y.o.  Admit date: 09/12/2020 Referring Physician: [place]  Primary Physician:  [name], MD Reason for Consultation:  Abnormal EKG  HPI:   70 y.o. African-American male  with hypertension, type 2 diabetes mellitus, history of kidney transplant 2010, CKD stage IIIb, with abnormal EKG.  Patient originally presented to [facility] earlier today with complaints of nausea, vomiting, diarrhea.  He denied any chest pain or shortness of breath on presentation.  Serial EKGs at [facility] showed at least 1 EKG with ST elevation in aVL, and T wave inversion in diffuse leads.  ST elevation has since resolved.  Given his concern for ST elevation, I was contacted.  On my interview, patient denies any chest pain. He recalls an episode of chest pain 2 days ago that lasted for 5 minutes, may have occurred after bike exercises.  However, denies any chest pain at any point today.  Past Medical History:  Diagnosis Date  . Diabetes mellitus without complication ([place])   . Hypertension   . Renal disorder      Past Surgical History:  Procedure Laterality Date  . KIDNEY TRANSPLANT       Family History  Problem Relation Age of Onset  . Diabetes Mellitus I Mother   . Diabetes Mellitus I Father      Social History: Social History   Socioeconomic History  . Marital status: Single    Spouse name: Not on file  . Number of children: Not on file  . Years of education: Not on file  . Highest education level: Not on file  Occupational History  . Not on file  Tobacco Use  . Smoking status: Former Research scientist (life sciences)  . Smokeless tobacco: Never Used  Vaping Use  . Vaping Use: Never used  Substance and Sexual Activity  . Alcohol use: Not Currently  . Drug use: Not Currently  . Sexual activity: Not on file  Other Topics Concern  . Not on file  Social History Narrative  . Not on  file   Social Determinants of Health   Financial Resource Strain: Not on file  Food Insecurity: Not on file  Transportation Needs: Not on file  Physical Activity: Not on file  Stress: Not on file  Social Connections: Not on file  Intimate Partner Violence: Not on file     Review of Systems  Constitutional: Negative for decreased appetite, malaise/fatigue, weight gain and weight loss.  HENT: Negative for congestion.   Eyes: Negative for visual disturbance.  Cardiovascular: Negative for chest pain, dyspnea on exertion, leg swelling, palpitations and syncope.  Respiratory: Negative for cough.   Endocrine: Negative for cold intolerance.  Hematologic/Lymphatic: Does not bruise/bleed easily.  Skin: Negative for itching and rash.  Musculoskeletal: Negative for myalgias.  Gastrointestinal: Positive for diarrhea, nausea and vomiting. Negative for abdominal pain.  Genitourinary: Negative for dysuria.  Neurological: Negative for dizziness and weakness.  Psychiatric/Behavioral: The patient is not nervous/anxious.   All other systems reviewed and are negative.     Physical Exam: Physical Exam Vitals and nursing note reviewed.  Constitutional:      General: He is not in acute distress.    Appearance: He is well-developed.  HENT:     Head: Normocephalic and atraumatic.  Eyes:     Conjunctiva/sclera: Conjunctivae normal.     Pupils: Pupils are equal, round, and reactive to light.  Neck:  Vascular: No JVD.  Cardiovascular:     Rate and Rhythm: Normal rate and regular rhythm.     Pulses: Decreased pulses.     Heart sounds: No murmur heard.   Pulmonary:     Effort: Pulmonary effort is normal.     Breath sounds: Normal breath sounds. No wheezing or rales.  Abdominal:     General: Bowel sounds are normal.     Palpations: Abdomen is soft.     Tenderness: There is no rebound.  Musculoskeletal:        General: No tenderness. Normal range of motion.     Right lower leg: No edema.      Left lower leg: No edema.  Lymphadenopathy:     Cervical: No cervical adenopathy.  Skin:    General: Skin is warm and dry.  Neurological:     Mental Status: He is alert and oriented to person, place, and time.     Cranial Nerves: No cranial nerve deficit.      Labs:   Lab Results  Component Value Date   WBC 5.4 09/12/2020   HGB 13.2 09/12/2020   HCT 41.4 09/12/2020   MCV 83.1 09/12/2020   PLT 187 09/12/2020    Recent Labs  Lab 09/12/20 1817  NA 139  K 3.9  CL 109  CO2 22  BUN 25*  CREATININE 1.68*  CALCIUM 9.5  PROT 7.9  BILITOT 0.4  ALKPHOS 54  ALT 15  AST 20  GLUCOSE 140*    Lipid Panel     Component Value Date/Time   TRIG 91 04/10/2019 1328    BNP (last 3 results) NA HEMOGLOBIN A1C Pending  Cardiac Panel (last 3 results) Results for CORT, DRAGOO (MRN [id number]) as of 09/13/2020 00:15  Ref. Range 09/12/2020 22:21 09/12/2020 22:55  Troponin I (High Sensitivity) Latest Ref Range: <18 ng/L 5 9    TSH NA   Radiology: CT Head Wo Contrast  Result Date: 09/12/2020 CLINICAL DATA:  Dizziness EXAM: CT HEAD WITHOUT CONTRAST TECHNIQUE: Contiguous axial images were obtained from the base of the skull through the vertex without intravenous contrast. COMPARISON:  None. FINDINGS: Brain: There is no mass, hemorrhage or extra-axial collection. The size and configuration of the ventricles and extra-axial CSF spaces are normal. There is hypoattenuation of the white matter, most commonly indicating chronic small vessel disease. Vascular: No abnormal hyperdensity of the major intracranial arteries or dural venous sinuses. No intracranial atherosclerosis. Skull: The visualized skull base, calvarium and extracranial soft tissues are normal. Sinuses/Orbits: No fluid levels or advanced mucosal thickening of the visualized paranasal sinuses. No mastoid or middle ear effusion. The orbits are normal. IMPRESSION: Chronic small vessel disease without acute intracranial  abnormality. Electronically Signed   By: Ulyses Jarred M.D.   On: 09/12/2020 22:07    Scheduled Meds: Continuous Infusions: . sodium chloride 10 mL/hr at 09/12/20 2259  . sodium chloride     PRN Meds:.  CARDIAC STUDIES:  EKG 09/12/2020 22:38 Sinus rhythm 59 bpm LVH, IVCD' T wave inversion likely due to LVH ST elevation now resolved  EKG 09/12/2020 22: 08 Sinus rhythm 62 bpm ST elevation lead aVL Diffuse T wave inversion inferolateral leads   Echocardiogram: None    Assessment & Recommendations:  70 y.o. African-American male  with hypertension, type 2 diabetes mellitus, history of kidney transplant 2010, CKD stage IIIb, with abnormal EKG.  Abnormal EKG: Isolated ST elevation in aVR, with diffuse inferolateral leads, now resolved on serial EKG. Patient denies  any chest pain on presentation. High-sensitivity troponin 5 & 9, done 34 minutes apart. Cycle another high-sensitivity troponin 2 hours apart. Presentation not consistent with acute coronary syndrome.  However, patient does have risk factors for CAD and reports 1 episode of chest pain 2 days ago, possibly after exercise.  Recommend Lexiscan nuclear stress test for risk stratification.  Defer work-up and management of abdominal pain, nausea, vomiting to ER/primary team.  CRITICAL CARE Performed by: Vernell Leep   Total critical care time: 35 minutes   Critical care time was exclusive of separately billable procedures and treating other patients.   Critical care was necessary to treat or prevent imminent or life-threatening deterioration.   Critical care was time spent personally by me on the following activities: development of treatment plan with patient and/or surrogate as well as nursing, discussions with consultants, evaluation of patient's response to treatment, examination of patient, obtaining history from patient or surrogate, ordering and performing treatments and interventions, ordering and review of  laboratory studies, ordering and review of radiographic studies, pulse oximetry and re-evaluation of patient's condition.       Nigel Mormon, MD Pager: [phone] Office: [phone]

## 2020-09-13 NOTE — H&P (Signed)
History and Physical    Bryan MadeiraWilliam T Rios ZOX:[id number] DOB: 06/18/1950 DOA: 09/12/2020  PCP: Temple PaciniBonsu, Osei A, DO [facility]  Chief Complaint: Multiple complaints  HPI: Bryan MadeiraWilliam T Crymes is a 70 y.o. male with medical history significant of CKD stage IIIb, history of renal transplant on immunosuppressants, hypertension, insulin-dependent type 2 diabetes [facility] with complaints of dizziness, nausea, vomiting, and diarrhea.  On arrival he was bradycardic and hypertensive.  Labs showing WBC 5.4, hemoglobin 13.2, platelet count 187K.  Sodium 139, potassium 3.9, chloride 109, bicarb 22, BUN 25, creatinine 1.6 (at baseline), glucose 140.  Lipase and LFTs normal.  UA not suggestive of infection.  Lactate within normal range. Head CT negative for acute finding.  High-sensitivity troponin negative x2. EKG revealed new ST elevation in aVR and aVL with depressions and TWI inferiorly and laterally.  Patient was given aspirin, beta-blocker, and heparin bolus.  Code STEMI was activated and patient was [facility]  [facility], EKG changes had resolved and patient denied chest pain.  He was seen by cardiology and code STEMI canceled.  Cardiology recommended Lexiscan nuclear stress test given risk factors for CAD and patient reporting an episode of exertional chest pain 2 days ago.  Repeat troponins remained negative.  CT abdomen pelvis showing no acute pathology.  Patient states he has felt dizzy for this past week.  Denies room spinning sensation.  He describes it as lightheadedness when going from sitting to standing position.  Reports history of dizziness for several years especially when he turns his head to the right.  When he drives, he cannot turn his head to the right as it makes him dizzy so instead he moves his entire body.  Also reports ongoing diarrhea for this past week.  Denies any recent antibiotic use or travel.  He has not used any  laxatives or stool softeners.  Denies abdominal pain.  States when he went to the emergency room he was feeling nauseous and vomited.  Denies any chest pain at present but does recall an episode of left-sided chest pain a few days ago which happened after he went to the gym.  Denies fevers, cough, or shortness of breath.  Review of Systems:  All systems reviewed and apart from history of presenting illness, are negative.  Past Medical History:  Diagnosis Date  . Diabetes mellitus without complication ([facility])   . Hypertension   . Renal disorder     Past Surgical History:  Procedure Laterality Date  . KIDNEY TRANSPLANT       reports that he has quit smoking. He has never used smokeless tobacco. He reports previous alcohol use. He reports previous drug use.  Allergies  Allergen Reactions  . Lisinopril Hives and Other (See Comments)    Weakness, shortness of breath LISINOPRIL Dermatological problems, e.g., rash, hives;--   . Metformin Other (See Comments) and Rash    weakness   . Terbinafine Hcl Other (See Comments)    Can't breath, throat swelling  . Amlodipine Other (See Comments)  . Nifedipine Other (See Comments)    fatigue fatigue NIFEDIPINE dizziness--   . Zocor [Simvastatin]     Other reaction(s): Unknown  . Diltiazem Hcl Other (See Comments)    fatigue fatigue     Family History  Problem Relation Age of Onset  . Diabetes Mellitus I Mother   . Diabetes Mellitus I Father     Prior to Admission  medications   Medication Sig Start Date End Date Taking? Authorizing Provider  acetaminophen (TYLENOL) 500 MG tablet Take 500-1,000 mg by mouth 2 (two) times daily as needed for mild pain or headache. 08/25/14  Yes [provider]  aspirin EC 81 MG tablet Take 81 mg by mouth daily.   Yes [provider]  atorvastatin (LIPITOR) 40 MG tablet Take 40 mg by mouth at bedtime. 06/27/20  Yes [provider]  ergocalciferol (VITAMIN D2) 1.25 MG (50000 UT)  capsule Take 50,000 Units by mouth every Sunday. 01/17/12  Yes [provider]  insulin aspart (NOVOLOG) 100 UNIT/ML injection Inject 0-5 Units into the skin See admin instructions. Per sliding scale 10/17/15  Yes [provider]  insulin glargine (LANTUS) 100 UNIT/ML injection Inject 15 Units into the skin 2 (two) times daily. 10/24/15  Yes [provider]  irbesartan (AVAPRO) 300 MG tablet Take 150 mg by mouth in the morning and at bedtime. 11/11/18  Yes [provider]  mycophenolate (MYFORTIC) 180 MG EC tablet Take 360 mg by mouth 2 (two) times daily. 03/03/19  Yes [provider]  tacrolimus (PROGRAF) 0.5 MG capsule Take 1-1.5 mg by mouth See admin instructions. 1.5 mg in the morning and 1 mg at bedtime 03/11/19  Yes [provider]  cyclobenzaprine (FLEXERIL) 10 MG tablet Take 1 tablet (10 mg total) by mouth 2 (two) times daily as needed for muscle spasms. Patient not taking: No sig reported 11/12/19   Truddie Hidden, MD    Physical Exam: Vitals:   09/13/20 0515 09/13/20 0615 09/13/20 0630 09/13/20 0645  BP: 129/70 (!) 141/76 (!) 145/74 (!) 155/82  Pulse: (!) 51 (!) 54 (!) 53 (!) 59  Resp: 15 17 15 15   Temp:      TempSrc:      SpO2: 99% 99% 98% 100%  Weight:      Height:        Physical Exam Constitutional:      General: He is not in acute distress. HENT:     Head: Normocephalic and atraumatic.  Eyes:     Conjunctiva/sclera: Conjunctivae normal.     Comments: Horizontal nystagmus when looking to the right  Cardiovascular:     Rate and Rhythm: Normal rate and regular rhythm.     Pulses: Normal pulses.  Pulmonary:     Effort: Pulmonary effort is normal. No respiratory distress.     Breath sounds: Normal breath sounds. No wheezing or rales.  Abdominal:     General: Bowel sounds are normal. There is no distension.     Palpations: Abdomen is soft.     Tenderness: There is no abdominal tenderness.  Musculoskeletal:         General: No swelling or tenderness.     Cervical back: Normal range of motion and neck supple.  Skin:    General: Skin is warm and dry.  Neurological:     General: No focal deficit present.     Mental Status: He is alert and oriented to person, place, and time.     Labs on Admission: I have personally reviewed following labs and imaging studies  CBC: Recent Labs  Lab 09/12/20 1817  WBC 5.4  HGB 13.2  HCT 41.4  MCV 83.1  PLT 123XX123   Basic Metabolic Panel: Recent Labs  Lab 09/12/20 1817  NA 139  K 3.9  CL 109  CO2 22  GLUCOSE 140*  BUN 25*  CREATININE 1.68*  CALCIUM 9.5  GFR: Estimated Creatinine Clearance: 44.8 mL/min (A) (by C-G formula based on SCr of 1.68 mg/dL (H)). Liver Function Tests: Recent Labs  Lab 09/12/20 1817  AST 20  ALT 15  ALKPHOS 54  BILITOT 0.4  PROT 7.9  ALBUMIN 4.6   Recent Labs  Lab 09/12/20 1817  LIPASE 34   No results for input(s): AMMONIA in the last 168 hours. Coagulation Profile: Recent Labs  Lab 09/12/20 2255  INR 1.0   Cardiac Enzymes: No results for input(s): CKTOTAL, CKMB, CKMBINDEX, TROPONINI in the last 168 hours. BNP (last 3 results) No results for input(s): PROBNP in the last 8760 hours. HbA1C: No results for input(s): HGBA1C in the last 72 hours. CBG: Recent Labs  Lab 09/12/20 1825  GLUCAP 128*   Lipid Profile: No results for input(s): CHOL, HDL, LDLCALC, TRIG, CHOLHDL, LDLDIRECT in the last 72 hours. Thyroid Function Tests: No results for input(s): TSH, T4TOTAL, FREET4, T3FREE, THYROIDAB in the last 72 hours. Anemia Panel: No results for input(s): VITAMINB12, FOLATE, FERRITIN, TIBC, IRON, RETICCTPCT in the last 72 hours. Urine analysis:    Component Value Date/Time   COLORURINE YELLOW 09/12/2020 2118   APPEARANCEUR CLEAR 09/12/2020 2118   LABSPEC 1.020 09/12/2020 2118   PHURINE 7.0 09/12/2020 2118   GLUCOSEU NEGATIVE 09/12/2020 2118   HGBUR NEGATIVE 09/12/2020 2118   [place] NEGATIVE  09/12/2020 2118   [place] NEGATIVE 09/12/2020 2118   PROTEINUR 30 (A) 09/12/2020 2118   NITRITE NEGATIVE 09/12/2020 2118   LEUKOCYTESUR NEGATIVE 09/12/2020 2118    Radiological Exams on Admission: CT ABDOMEN PELVIS WO CONTRAST  Result Date: 09/13/2020 CLINICAL DATA:  Dizziness and diarrhea x1 week EXAM: CT ABDOMEN AND PELVIS WITHOUT CONTRAST TECHNIQUE: Multidetector CT imaging of the abdomen and pelvis was performed following the standard protocol without IV contrast. COMPARISON:  None. FINDINGS: Lower chest: Bibasilar atelectasis. Normal size heart. No significant pericardial effusion/thickening. Hepatobiliary: Unremarkable noncontrast appearance of the hepatic parenchyma. Gallbladder is mildly distended but otherwise unremarkable. No biliary ductal dilation. Pancreas: Within normal limits. Spleen: Within normal limits. Adrenals/Urinary Tract: Bilateral adrenal glands are unremarkable. The bilateral native kidneys are atrophic. Left lower quadrant transplant kidney without hydronephrosis. No renal, ureteral or bladder calculi visualized. Urinary bladder is grossly unremarkable for degree of distension. Stomach/Bowel: Stomach is grossly unremarkable. No pathologic dilation of small bowel. Normal appendix. Colonic diverticulosis without findings of acute diverticulitis. Vascular/Lymphatic: Aortic atherosclerosis. No enlarged abdominal or pelvic lymph nodes. Reproductive: Prostate is unremarkable.  Penile prosthesis. Other: No abdominopelvic ascites or pneumoperitoneum Musculoskeletal: No acute osseous abnormality IMPRESSION: 1. No acute findings in the abdomen or pelvis. 2. Colonic diverticulosis without findings of acute diverticulitis. 3. Left lower quadrant transplant kidney without hydronephrosis. 4. Aortic atherosclerosis. Aortic Atherosclerosis (ICD10-I70.0). Electronically Signed   By: Dahlia Bailiff MD   On: 09/13/2020 02:05   CT Head Wo Contrast  Result Date: 09/12/2020 CLINICAL DATA:   Dizziness EXAM: CT HEAD WITHOUT CONTRAST TECHNIQUE: Contiguous axial images were obtained from the base of the skull through the vertex without intravenous contrast. COMPARISON:  None. FINDINGS: Brain: There is no mass, hemorrhage or extra-axial collection. The size and configuration of the ventricles and extra-axial CSF spaces are normal. There is hypoattenuation of the white matter, most commonly indicating chronic small vessel disease. Vascular: No abnormal hyperdensity of the major intracranial arteries or dural venous sinuses. No intracranial atherosclerosis. Skull: The visualized skull base, calvarium and extracranial soft tissues are normal. Sinuses/Orbits: No fluid levels or advanced mucosal thickening of the visualized paranasal sinuses. No  mastoid or middle ear effusion. The orbits are normal. IMPRESSION: Chronic small vessel disease without acute intracranial abnormality. Electronically Signed   By: Ulyses Jarred M.D.   On: 09/12/2020 22:07   MR BRAIN WO CONTRAST  Result Date: 09/13/2020 CLINICAL DATA:  70 year old male with possible STEMI. Dizziness. Vomiting. EXAM: MRI HEAD WITHOUT CONTRAST TECHNIQUE: Multiplanar, multiecho pulse sequences of the brain and surrounding structures were obtained without intravenous contrast. COMPARISON:  Head CT 09/12/2020. FINDINGS: Brain: No restricted diffusion to suggest acute infarction. No midline shift, mass effect, evidence of mass lesion, ventriculomegaly, extra-axial collection or acute intracranial hemorrhage. Cervicomedullary junction and pituitary are within normal limits. Patchy mild to moderate for age bilateral cerebral white matter T2 and FLAIR hyperintensity, mostly periventricular. No cortical encephalomalacia identified. No convincing chronic cerebral blood products. Deep gray matter nuclei, brainstem and cerebellum are within normal limits for age. Vascular: Major intracranial vascular flow voids are preserved. Dominant right vertebral artery.  Skull and upper cervical spine: Chronic C3-C4 disc and endplate degeneration. Normal background bone marrow signal. Sinuses/Orbits: Negative orbits soft tissues. Trace paranasal sinus mucosal thickening. Other: Mastoid air cells are clear. Visible internal auditory structures appear normal. Visible stylomastoid foramina and parotid glands appear within normal limits. IMPRESSION: 1. No acute intracranial abnormality. 2. Mild to moderate for age nonspecific cerebral white matter signal changes, most commonly due to chronic small vessel disease. Electronically Signed   By: Genevie Ann M.D.   On: 09/13/2020 06:12    EKG: Independently reviewed.  Initial EKG showing sinus rhythm with new ST elevation in aVR and aVL with depressions and TWI inferiorly and laterally.  Repeat EKG showing resolution ST/T wave changes.  Assessment/Plan Principal Problem:   Dizziness Active Problems:   Vomiting   Diarrhea   Abnormal EKG   Bradycardia  Dizziness/lightheadedness Describes it as lightheadedness when going from sitting to standing position but also reports longstanding history of dizziness when turning his head to the right. ?BPPV.  Has horizontal nystagmus when looking to the right.  Head CT and brain MRI negative for acute finding. -PT vestibular evaluation, check orthostatics  Diarrhea COVID and influenza PCR negative.  No fever or leukocytosis.  CT abdomen pelvis showing no acute pathology.  No recent antibiotic use. -GI pathogen panel, enteric precautions  Nausea, vomiting COVID and influenza PCR negative.  No fever or leukocytosis.  Lipase and LFTs normal.  UA not suggestive of infection.  CT abdomen pelvis showing no acute pathology.  He is no longer nauseous and wants to eat. -Symptomatic management: IV fluid hydration, antiemetic as needed.  EKG changes Initial EKG was concerning for STEMI but repeat EKG showing resolution of these changes.  High-sensitivity troponin negative x4. Cardiology canceled  code STEMI.  No chest pain at present but does report an episode of exertional chest pain a few days ago. -Nuclear stress test this morning.  A1c and lipid panel pending.  Sinus bradycardia Heart rate in the 50s to 60s.  Not on any AV nodal blocking agents. -Cardiac monitoring, check TSH level  CKD stage IIIb, history of renal transplant Renal function at baseline. -Continue mycophenolate and tacrolimus  Uncontrolled hypertension Blood pressure was elevated initially but now improved. -Continue home irbesartan.  IV hydralazine PRN.   Hyperlipidemia -Continue Lipitor  Insulin-dependent type 2 diabetes -Check A1c.  Continue home Lantus.  Order sliding scale insulin.  DVT prophylaxis: Lovenox Code Status: Full code Family Communication: No family at bedside. Disposition Plan: Status is: Inpatient  Remains inpatient appropriate because:Inpatient level  of care appropriate due to severity of illness   Dispo: The patient is from: Home              Anticipated d/c is to: Home              Patient currently is not medically stable to d/c.   Difficult to place patient No  Level of care: Level of care: Telemetry Cardiac   The medical decision making on this patient was of high complexity and the patient is at high risk for clinical deterioration, therefore this is a level 3 visit.   Shela Leff MD [facility]  If 7PM-7AM, please contact night-coverage [email or web address]  09/13/2020, 7:14 AM

## 2020-09-13 NOTE — Evaluation (Signed)
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 Pt was indep PTA but has also began to experience balance impairment and weakness in his LEs. Pt reports pain on R side of neck with rotation as well, unsure if patient has cervical stenosis as no imaging has been done. Pt with noted significant balance impairment when amb with PT in room requiring modA to prevent a fall. Acute PT to return tomorrow to further address vestibular systoms and d/c recommendations.    Follow Up Recommendations Outpatient PT;Supervision/Assistance - 24 hour (for vestibular therapy)    Equipment Recommendations   (may need RW)    Recommendations for Other Services       Precautions / Restrictions Precautions Precautions: Fall Precaution Comments: light headed with head turns to R, unbalanced Restrictions Weight Bearing Restrictions: No      Mobility  Bed Mobility Overal bed mobility: Needs Assistance Bed Mobility: Supine to Sit;Sit to Supine     Supine to sit: Min assist Sit to supine: Min assist   General bed mobility comments: minA due to instability when sitting up, mildly dizzy but settled, pt stated "I need to lay down but was able to push through" pt also with sensation when returning to supine due to moving to fast    Transfers Overall transfer level: Needs assistance Equipment used: 1 person hand held assist Transfers: Sit  to/from Stand Sit to Stand: Min assist         General transfer comment: slow and guarded, minA via R HHA to steady pt, pt reporting "I just feel so weak"  Ambulation/Gait Ambulation/Gait assistance: Min assist;Mod assist Gait Distance (Feet): 10 Feet Assistive device: 1 person hand held assist Gait Pattern/deviations: Step-through pattern;Decreased stride length;Staggering left;Staggering right Gait velocity: slow Gait velocity interpretation: <1.8 ft/sec, indicate of risk for recurrent falls General Gait Details: pt with wide base of support and staggering, reaching for bed and utilizing PT for support, pt with significant LOB to the L requiring modA to prevent falling, was only able to amb to chair  Stairs            Wheelchair Mobility    Modified Rankin (Stroke Patients Only)       Balance Overall balance assessment: Needs assistance Sitting-balance support: Feet supported;Bilateral upper extremity supported Sitting balance-Leahy Scale: Fair     Standing balance support: Single extremity supported Standing balance-Leahy Scale: Poor Standing balance comment: requires external support                             Pertinent Vitals/Pain Pain Assessment: Faces Faces Pain Scale: Hurts little more Pain Location: discomfort on R side of neck when turning Pain Descriptors / Indicators: Discomfort    Home Living Family/patient expects to be discharged to:: Private residence Living Arrangements: Non-relatives/Friends Available Help at Discharge: Friend(s);Available 24 hours/day             Additional Comments: pt lives  with a roommate Santiago Glad, family in [place]    Prior Function Level of Independence: Independent         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     Hand Dominance   Dominant Hand: Right    Extremity/Trunk Assessment   Upper Extremity Assessment Upper Extremity Assessment: Generalized weakness    Lower Extremity  Assessment Lower Extremity Assessment: Generalized weakness    Cervical / Trunk Assessment Cervical / Trunk Assessment: Normal  Communication   Communication: No difficulties  Cognition Arousal/Alertness: Awake/alert Behavior During Therapy: WFL for tasks assessed/performed Overall Cognitive Status: Within Functional Limits for tasks assessed                                        General Comments General comments (skin integrity, edema, etc.): vestibular exam completed, suspect R vertebral artery conclusion    Exercises     Assessment/Plan    PT Assessment Patient needs continued PT services  PT Problem List Decreased strength;Decreased activity tolerance;Decreased mobility;Decreased balance       PT Treatment Interventions DME instruction;Gait training;Stair training;Functional mobility training;Therapeutic activities;Therapeutic exercise;Balance training    PT Goals (Current goals can be found in the Care Plan section)  Acute Rehab PT Goals Patient Stated Goal: get better to move back to [place] PT Goal Formulation: With patient Time For Goal Achievement: 09/27/20 Potential to Achieve Goals: Good    Frequency Min 3X/week   Barriers to discharge        Co-evaluation               AM-PAC PT "6 Clicks" Mobility  Outcome Measure Help needed turning from your back to your side while in a flat bed without using bedrails?: A Little Help needed moving from lying on your back to sitting on the side of a flat bed without using bedrails?: A Little Help needed moving to and from a bed to a chair (including a wheelchair)?: A Little Help needed standing up from a chair using your arms (e.g., wheelchair or bedside chair)?: A Little Help needed to walk in hospital room?: A Lot Help needed climbing 3-5 steps with a railing? : A Lot 6 Click Score: 16    End of Session Equipment Utilized During Treatment: Gait belt Activity Tolerance: Patient tolerated  treatment well Patient left: in chair;with call bell/phone within reach Nurse Communication: Mobility status PT Visit Diagnosis: Unsteadiness on feet (R26.81);Muscle weakness (generalized) (M62.81);Difficulty in walking, not elsewhere classified (R26.2)    Time: 6314-9702 PT Time Calculation (min) (ACUTE ONLY): 36 min   Charges:   PT Evaluation $PT Eval Moderate Complexity: 1 Mod PT Treatments $Canalith Rep Proc: 8-22 mins        Kittie Plater, PT, DPT Acute Rehabilitation Services Pager #: [phone] Office #: [phone]   Berline Lopes 09/13/2020, 5:56 PM

## 2020-09-13 NOTE — ED Notes (Signed)
Report given to [facility] Nurse.

## 2020-09-13 NOTE — Progress Notes (Signed)
Patient seen and examined this morning, admitted overnight, H&P reviewed and agree with the assessment and plan.  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  Patient reports several weeks of intermittent GI upset, started about couple of weeks ago when he was eating some leftover food, had diarrhea and GI upset but improved.  He had a recurrent episode about a week ago somewhat similar in quality.  On the day of admission he went to an event where he ate hot dog, and then afterwards started feeling diaphoretic, dizzy, and had several episodes of vomiting followed by watery diarrhea.  He also reports dizziness with aspirin somewhat going on for quite some time weeks if not months, especially when he turns his head to the right side.  Patient also experienced an episode of chest pain about couple of days ago.  In the ED he had some EKG abnormalities with ST segment elevation code STEMI was called.  Cardiology canceled the code STEMI but they did not recommend a stress test given risk factors and chest pain   This morning he continues to feel really weak, complains of ongoing nausea.  Has not been able to eat anything in a few days.  Keep n.p.o. for now for stress test, following that we will slowly advance diet.  If he has further diarrhea infectious work-up will be sent for admission ND.  His kidney function is at baseline and he is to continue on his home medications  Scheduled Meds: . aspirin EC  81 mg Oral Daily  . atorvastatin  40 mg Oral QHS  . enoxaparin (LOVENOX) injection  40 mg Subcutaneous Q24H  . insulin aspart  0-5 Units Subcutaneous QHS  . insulin aspart  0-9 Units Subcutaneous TID WC  . insulin glargine  15 Units Subcutaneous BID  . irbesartan  150 mg Oral BID  . mycophenolate  360 mg Oral BID  . tacrolimus  0.5 mg Oral See admin instructions   Continuous Infusions: .  sodium chloride Stopped (09/13/20 0654)  . sodium chloride Stopped (09/13/20 0654)  . promethazine (PHENERGAN) injection (IM or IVPB)     PRN Meds:.acetaminophen **OR** acetaminophen, hydrALAZINE, promethazine (PHENERGAN) injection (IM or IVPB)    Bryan Rios M. Cruzita Lederer, MD, PhD [facility]  Between 7 am - 7 pm you can contact me via Amion (for emergencies) or [place] (non urgent matters).  I am not available 7 pm - 7 am, please contact night coverage MD/APP via Amion

## 2020-09-14 ENCOUNTER — Inpatient Hospital Stay (HOSPITAL_COMMUNITY): Payer: HMO

## 2020-09-14 DIAGNOSIS — R197 Diarrhea, unspecified: Secondary | ICD-10-CM

## 2020-09-14 DIAGNOSIS — R112 Nausea with vomiting, unspecified: Secondary | ICD-10-CM

## 2020-09-14 DIAGNOSIS — R9431 Abnormal electrocardiogram [ECG] [EKG]: Secondary | ICD-10-CM

## 2020-09-14 LAB — BASIC METABOLIC PANEL
Anion gap: 6 (ref 5–15)
BUN: 24 mg/dL — ABNORMAL HIGH (ref 8–23)
CO2: 25 mmol/L (ref 22–32)
Calcium: 9.2 mg/dL (ref 8.9–10.3)
Chloride: 108 mmol/L (ref 98–111)
Creatinine, Ser: 1.79 mg/dL — ABNORMAL HIGH (ref 0.61–1.24)
GFR, Estimated: 41 mL/min — ABNORMAL LOW (ref >60)
Glucose, Bld: 84 mg/dL (ref 70–99)
Potassium: 4.2 mmol/L (ref 3.5–5.1)
Sodium: 139 mmol/L (ref 135–145)

## 2020-09-14 LAB — GLUCOSE, CAPILLARY
Glucose-Capillary: 101 mg/dL — ABNORMAL HIGH (ref 70–99)
Glucose-Capillary: 144 mg/dL — ABNORMAL HIGH (ref 70–99)

## 2020-09-14 IMAGING — MR MR MRA NECK W/O CM
3 series · 19 of 48 positions shown · non-contrast
Comparison: MRI of the brain [DATE].

CLINICAL DATA: Dizziness, persistent/recurrent, cardiac or vascular
cause suspected.

EXAM:
MRA NECK WITHOUT CONTRAST
MRA HEAD WITHOUT CONTRAST
TECHNIQUE: Angiographic images of the neck were obtained using MRA technique
without intravenous contrast; Angiographic images of the [place] were obtained using MRA technique without intravenous
contrast.

[Series 3: (id) mt fs · axial · 1.4mm · 0.43mm/px · z∈[-50,+35]mm · 11 of 136 slices shown]
[im 7/136]
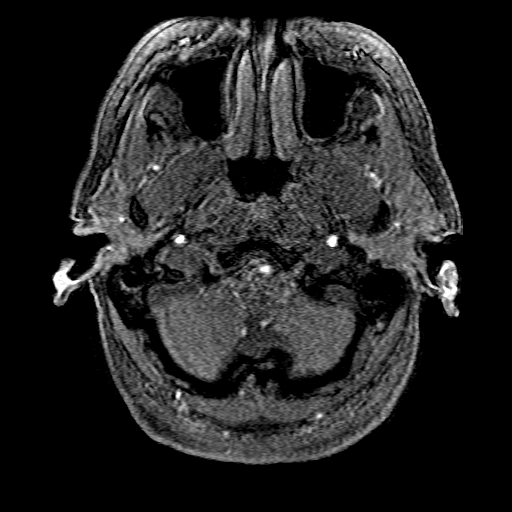
[im 19/136]
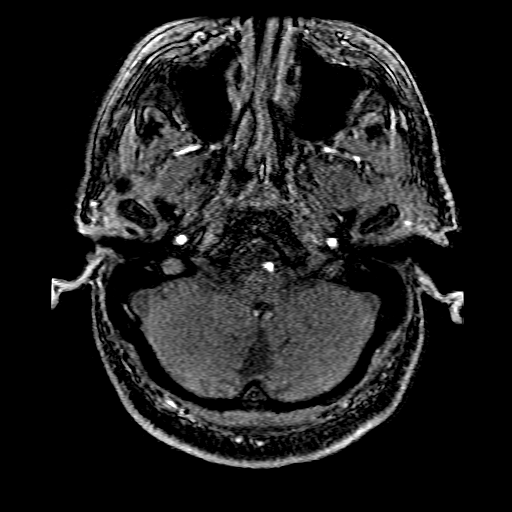
[im 25/136]
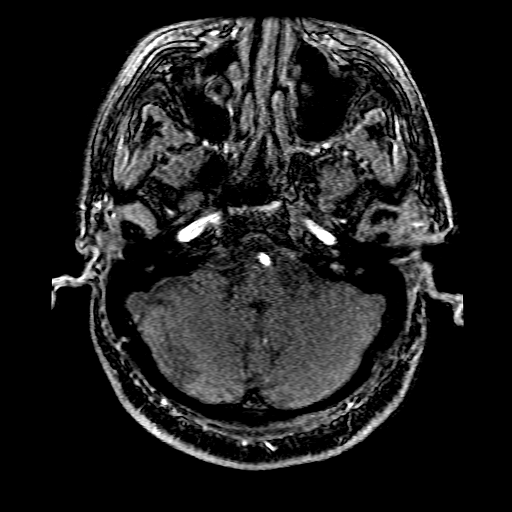
[im 43/136]
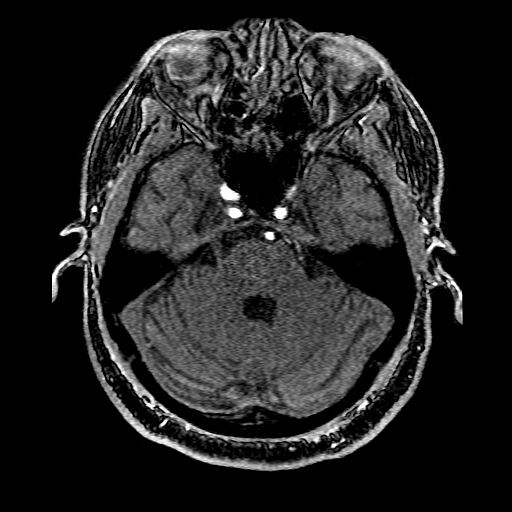
[im 62/136]
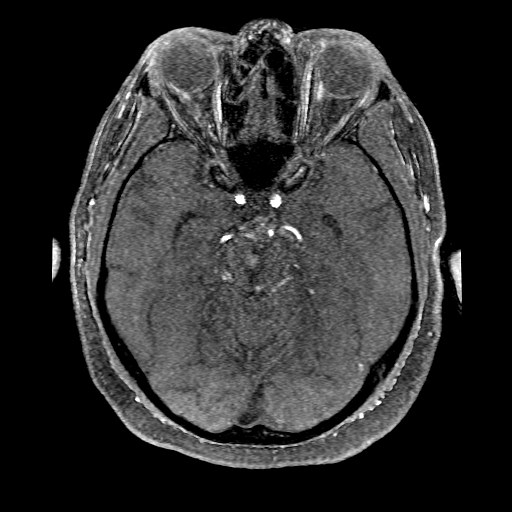
[im 68/136]
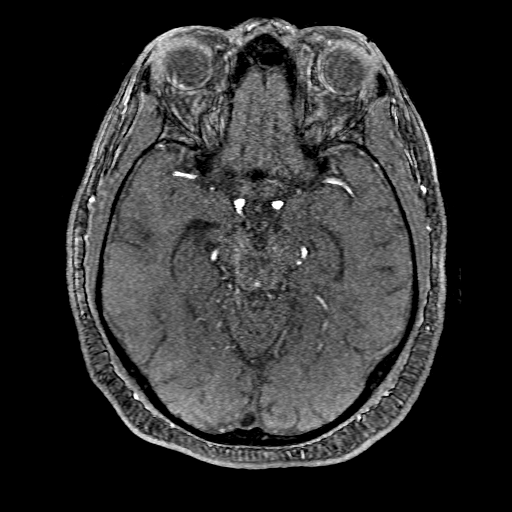
[im 74/136]
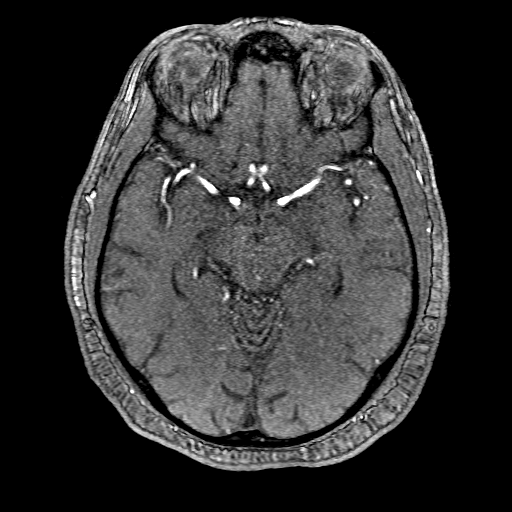
[im 93/136]
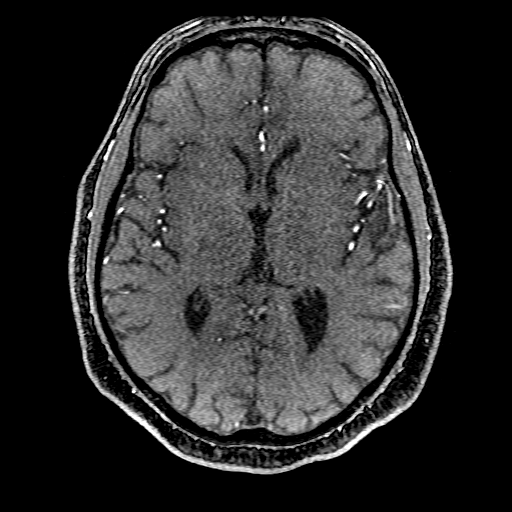
[im 111/136]
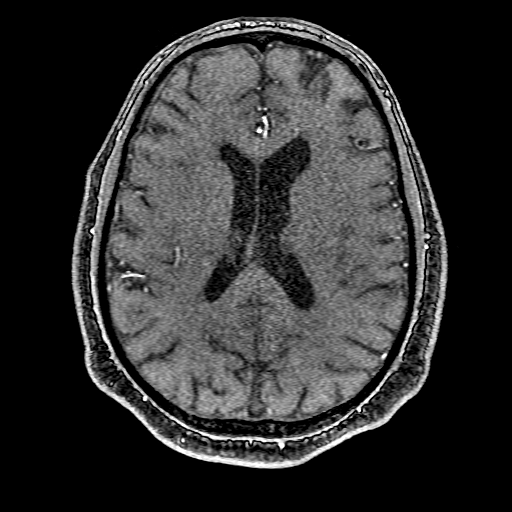
[im 117/136]
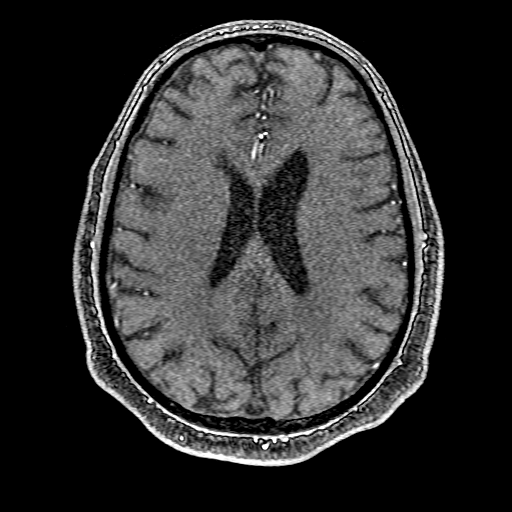
[im 129/136]
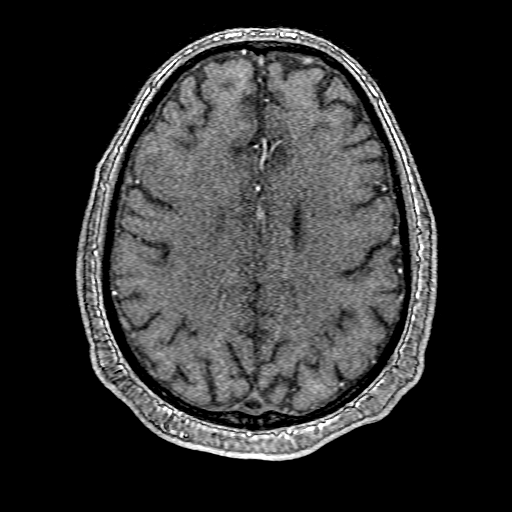

[Series 6: ax (id) · axial · 2.8mm · 0.47mm/px · z∈[-275,-116]mm · 7 of 140 slices shown]
[im 7/140]
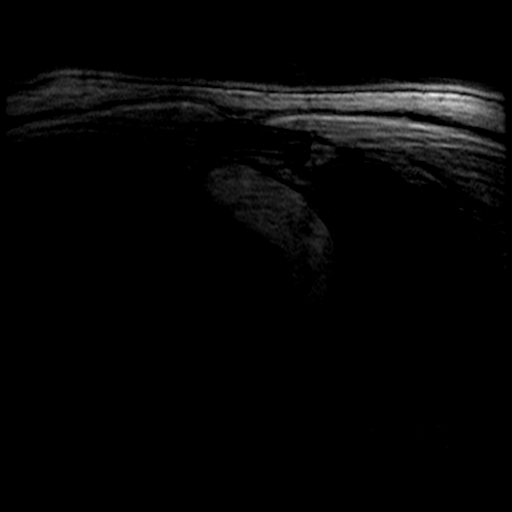
[im 25/140]
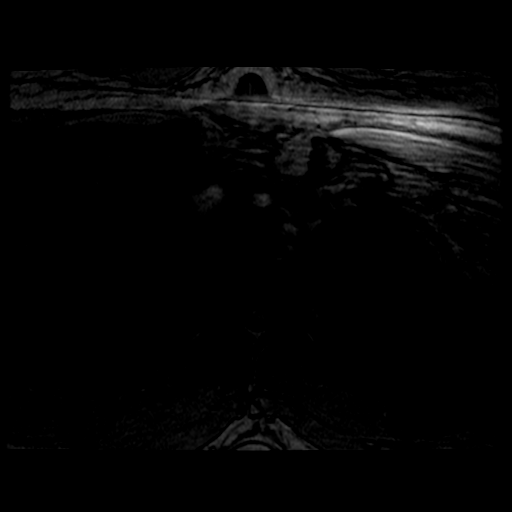
[im 43/140]
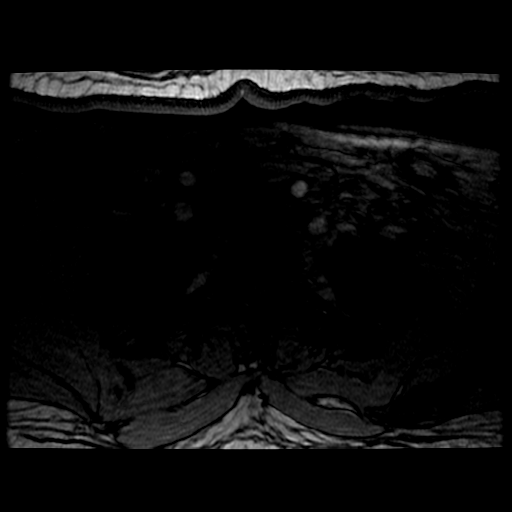
[im 61/140]
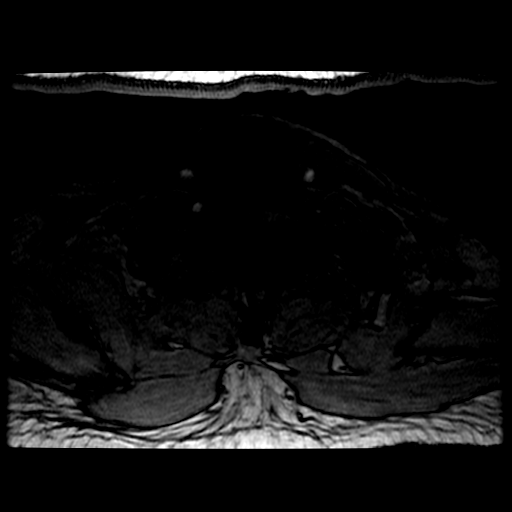
[im 73/140]
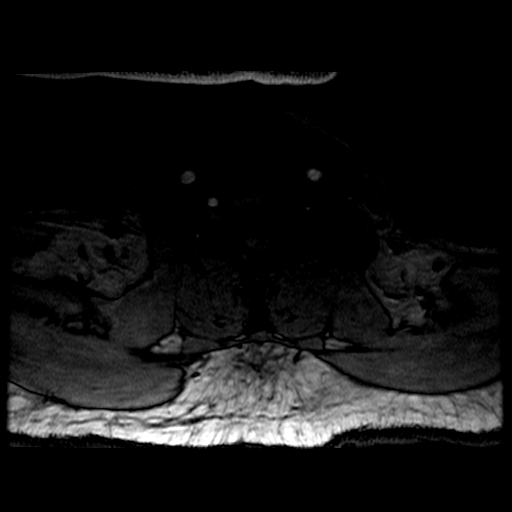
[im 79/140]
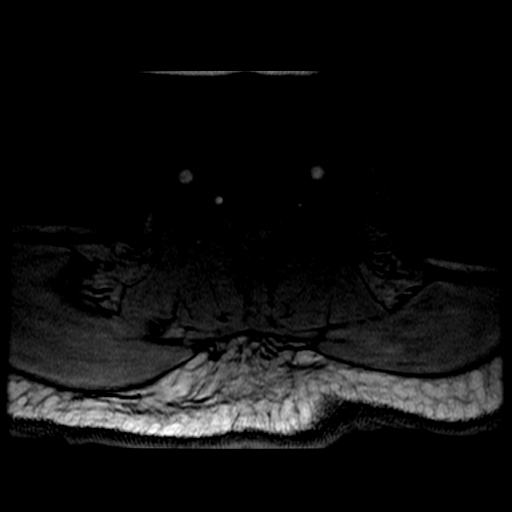
[im 121/140]
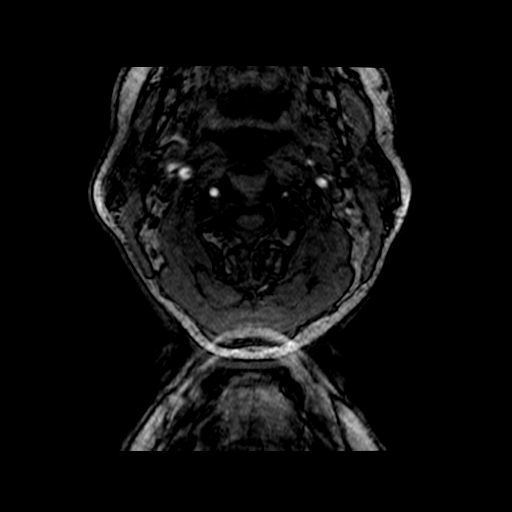

[Series 305: processed images · axial · 1.4mm · 0.43mm/px · 1 of 1 slices shown]
[im 1/1]
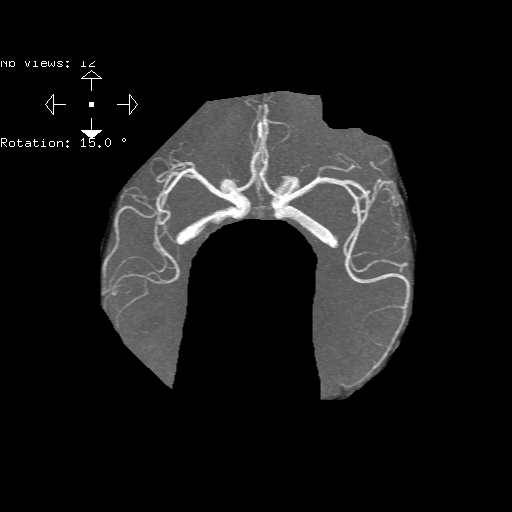

[19 of 48 positions shown; findings below may reference images not displayed]

FINDINGS: MRA NECK FINDINGS

Aortic arch: Normal 3 vessel aortic branching pattern. The
visualized subclavian arteries are normal.

Right carotid system: Normal course and caliber without stenosis or
evidence of dissection.

Left carotid system: Normal course and caliber without stenosis or
evidence of dissection.

Vertebral arteries: Right dominant. Vertebral artery origins are
normal. The left vertebral artery is hypoplastic throughout its
entire length and ends in PICA. Focal area of stenosis in the V2
segment of the right vertebral artery measuring up to 60% stenosis
when measured on the source images (series 6, image 83).

MRA HEAD FINDINGS

The visualized portions of the distal cervical and intracranial
internal carotid arteries are widely patent with normal flow related
enhancement. The bilateral anterior cerebral arteries and middle
cerebral arteries are widely patent with antegrade flow without
high-grade flow-limiting stenosis or proximal branch occlusion. No
intracranial aneurysm within the anterior circulation.

The vertebral arteries are patent with antegrade flow. The right
vertebral artery is dominant. The left vertebral artery ends in
PICA. Basilar artery is widely patent with antegrade flow without
evidence of basilar stenosis or aneurysm. Posterior cerebral
arteries are normal bilaterally. No intracranial aneurysm within the
posterior circulation.
IMPRESSION: 1. Area of focal stenosis in the V2 segment of the dominant right
vertebral artery measuring approximately 60% on the source images.
2. Hypoplastic left vertebral artery.

## 2020-09-14 IMAGING — MR MR MRA HEAD W/O CM
3 series · 19 of 48 positions shown · non-contrast
Comparison: MRI of the brain [DATE].

CLINICAL DATA: Dizziness, persistent/recurrent, cardiac or vascular
cause suspected.

EXAM:
MRA NECK WITHOUT CONTRAST
MRA HEAD WITHOUT CONTRAST
TECHNIQUE: Angiographic images of the neck were obtained using MRA technique
without intravenous contrast; Angiographic images of the [place] were obtained using MRA technique without intravenous
contrast.

[Series 3: (id) mt fs · axial · 1.4mm · 0.43mm/px · z∈[-50,+35]mm · 11 of 136 slices shown]
[im 7/136]
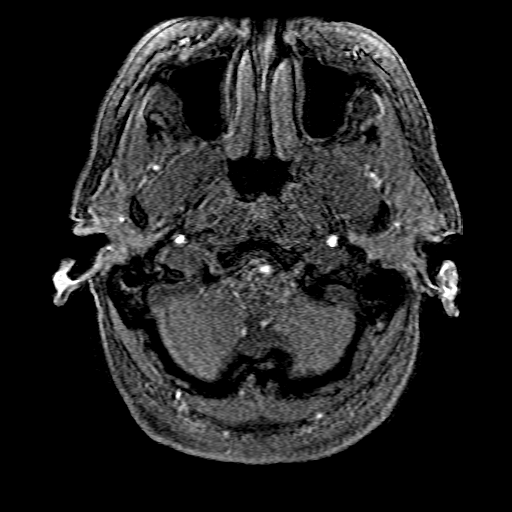
[im 19/136]
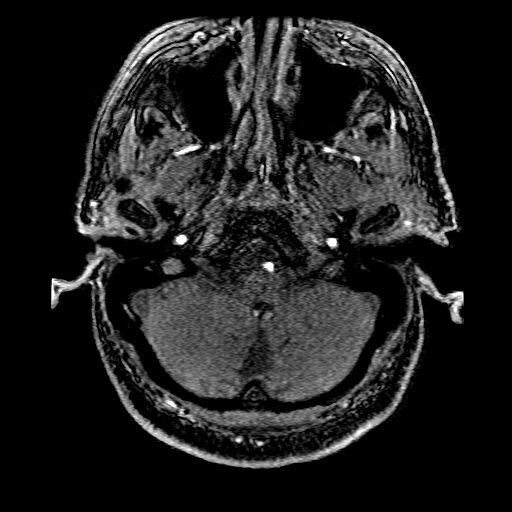
[im 25/136]
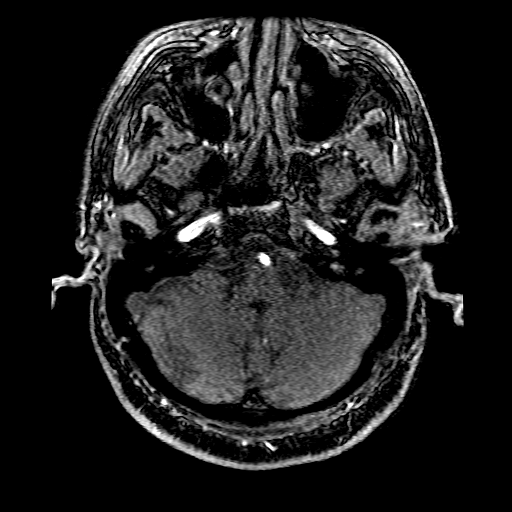
[im 43/136]
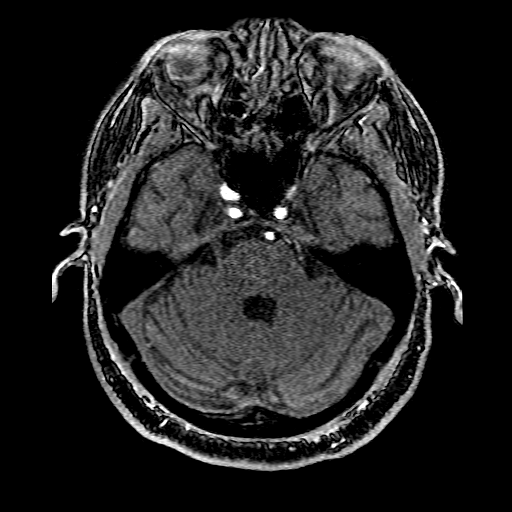
[im 62/136]
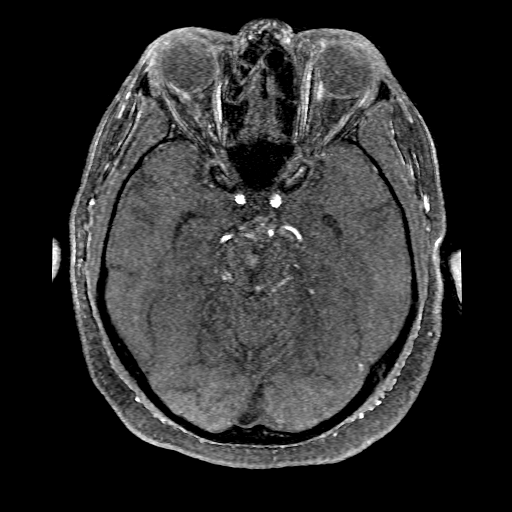
[im 68/136]
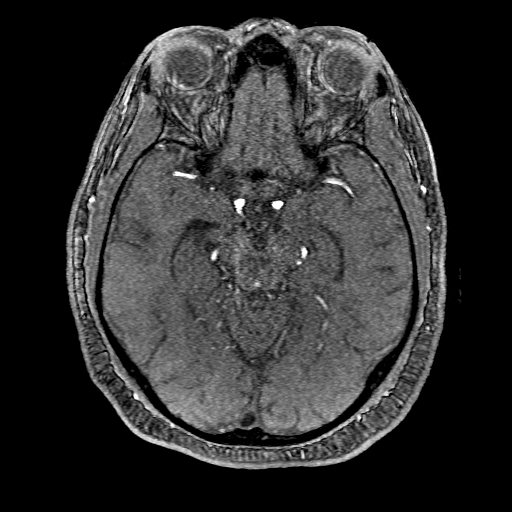
[im 74/136]
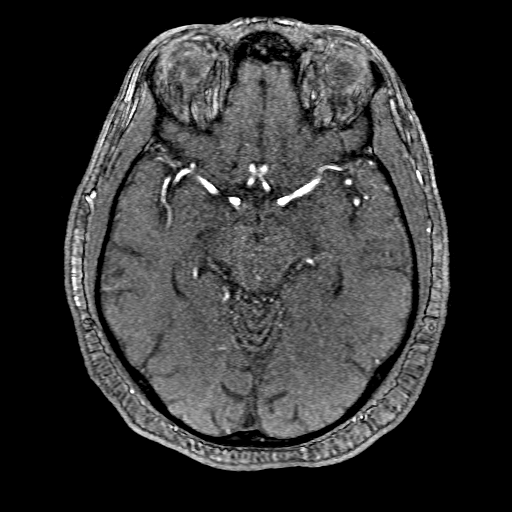
[im 93/136]
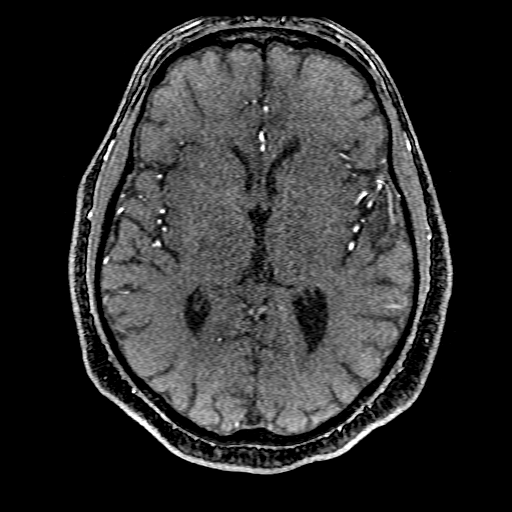
[im 111/136]
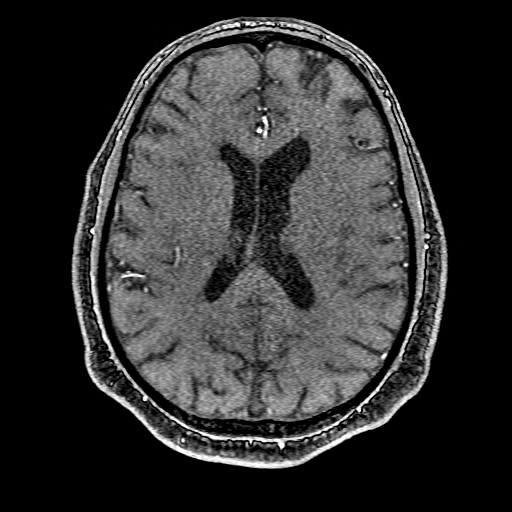
[im 117/136]
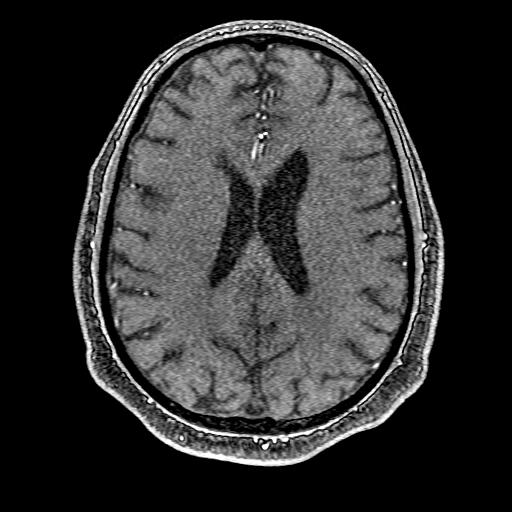
[im 129/136]
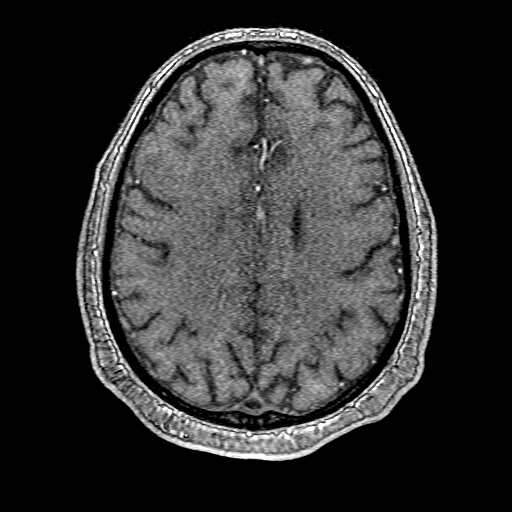

[Series 6: ax (id) · axial · 2.8mm · 0.47mm/px · z∈[-275,-116]mm · 7 of 140 slices shown]
[im 7/140]
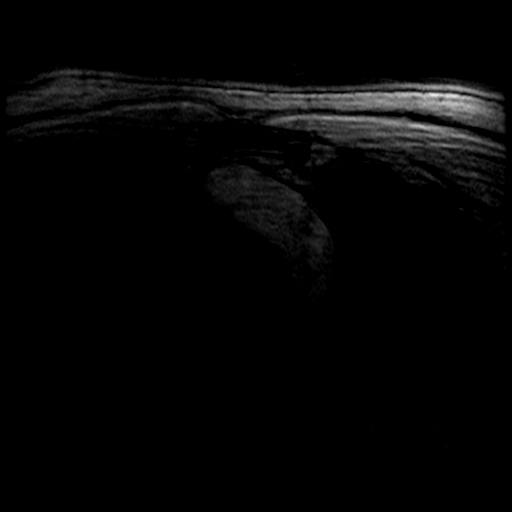
[im 25/140]
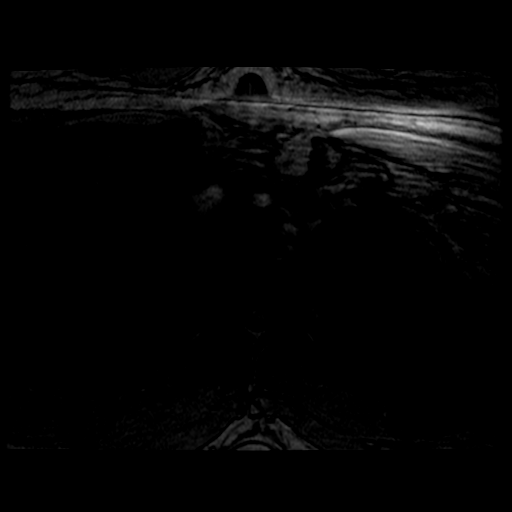
[im 43/140]
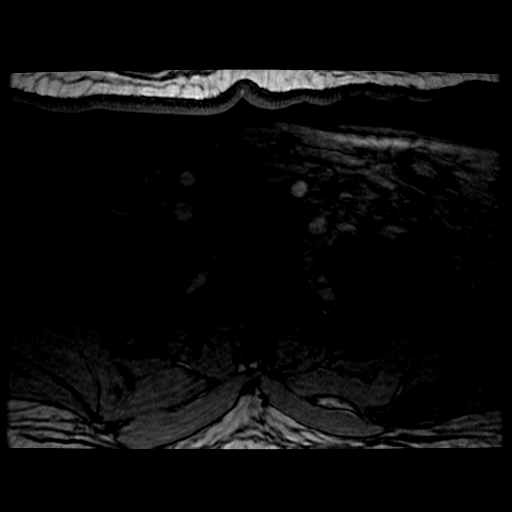
[im 61/140]
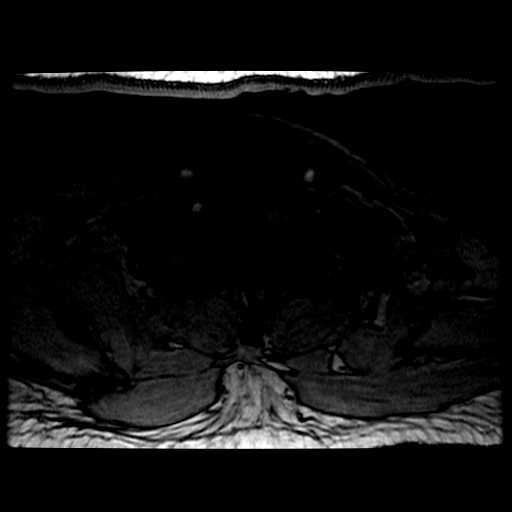
[im 73/140]
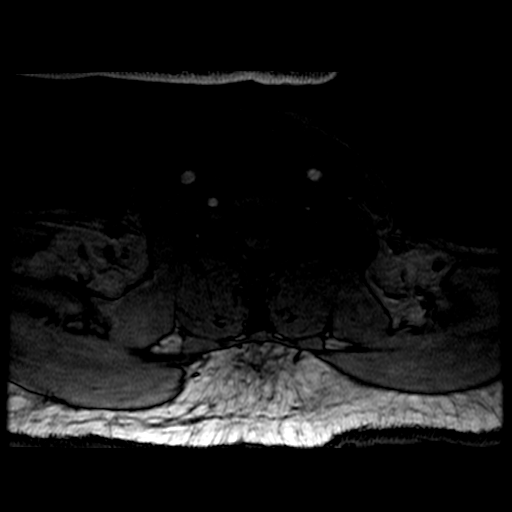
[im 79/140]
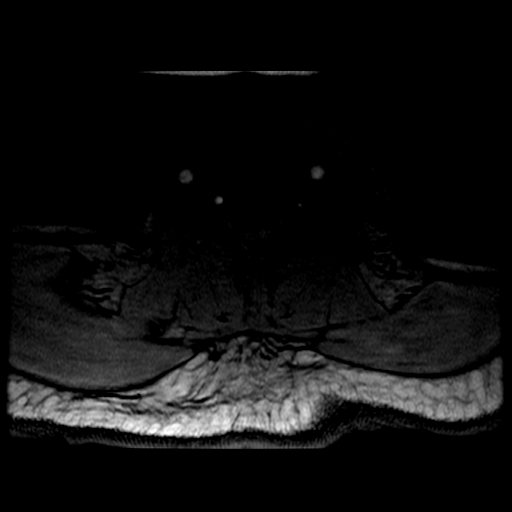
[im 121/140]
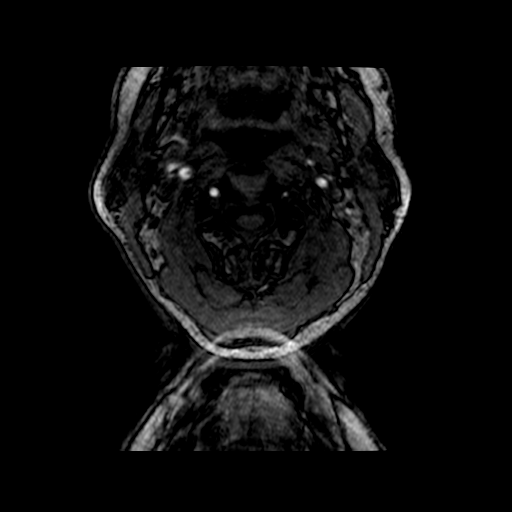

[Series 305: processed images · axial · 1.4mm · 0.43mm/px · 1 of 1 slices shown]
[im 1/1]
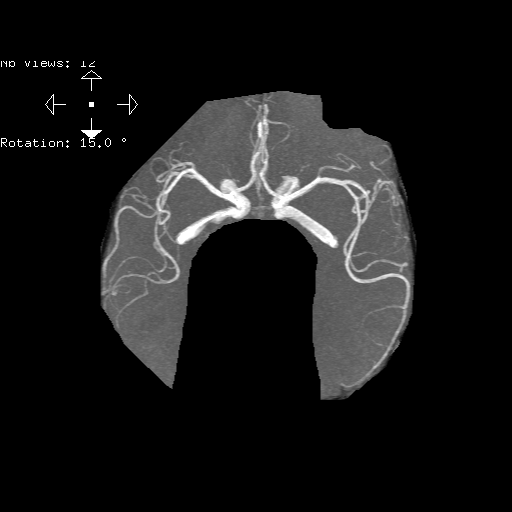

[19 of 48 positions shown; findings below may reference images not displayed]

FINDINGS: MRA NECK FINDINGS

Aortic arch: Normal 3 vessel aortic branching pattern. The
visualized subclavian arteries are normal.

Right carotid system: Normal course and caliber without stenosis or
evidence of dissection.

Left carotid system: Normal course and caliber without stenosis or
evidence of dissection.

Vertebral arteries: Right dominant. Vertebral artery origins are
normal. The left vertebral artery is hypoplastic throughout its
entire length and ends in PICA. Focal area of stenosis in the V2
segment of the right vertebral artery measuring up to 60% stenosis
when measured on the source images (series 6, image 83).

MRA HEAD FINDINGS

The visualized portions of the distal cervical and intracranial
internal carotid arteries are widely patent with normal flow related
enhancement. The bilateral anterior cerebral arteries and middle
cerebral arteries are widely patent with antegrade flow without
high-grade flow-limiting stenosis or proximal branch occlusion. No
intracranial aneurysm within the anterior circulation.

The vertebral arteries are patent with antegrade flow. The right
vertebral artery is dominant. The left vertebral artery ends in
PICA. Basilar artery is widely patent with antegrade flow without
evidence of basilar stenosis or aneurysm. Posterior cerebral
arteries are normal bilaterally. No intracranial aneurysm within the
posterior circulation.
IMPRESSION: 1. Area of focal stenosis in the V2 segment of the dominant right
vertebral artery measuring approximately 60% on the source images.
2. Hypoplastic left vertebral artery.

## 2020-09-14 NOTE — Progress Notes (Signed)
D/C instructions given and reviewed. Tele and IV's removed, tolerated well. Stated he will call for his ride after he eats lunch.

## 2020-09-14 NOTE — TOC Transition Note (Signed)
Transition of Care [facility]) - CM/SW Discharge Note   Patient Details  Name: Bryan Rios MRN: [id number] Date of Birth: 10-26-1950  [facility]) CM/SW Contact:  Zenon Mayo, RN Phone Number: 09/14/2020, 12:09 PM   Clinical Narrative:    Patient is for dc today, he is set up with outpatient vestibular therapy with  [facility] on [address].  Referral sent thru epic. NCM also spoke with Manuela Schwartz at the [facility] in [place] on [address], she states they do vestibular therapy there and they will be in contact with patient by June 6 for apt. This information was given to patient.  He states he would like to get a rolling walker and is ok with [employer] supplying it for him.  NCM made referral to [facility] with [employer], the walker will be brought to his room prior to dc.    Final next level of care: Home/Self Care Barriers to Discharge: No Barriers Identified   Patient Goals and CMS Choice Patient states their goals for this hospitalization and ongoing recovery are:: return home   Choice offered to / list presented to : NA  Discharge Placement                       Discharge Plan and Services                DME Arranged: Walker rolling DME Agency: [employer] Date DME Agency Contacted: 09/14/20 Time DME Agency Contacted: 1208 Representative spoke with at DME Agency: [place]: NA          Social Determinants of Health ([place]) Interventions     Readmission Risk Interventions No flowsheet data found.

## 2020-09-14 NOTE — Progress Notes (Addendum)
Physical Therapy Treatment/vestibular Patient Details Name: Bryan Rios MRN: [id number] DOB: 1950/10/26 Today's Date: 09/14/2020    History of Present Illness 70 year old male  who came in with dizziness, nausea, vomiting and diarrhea.  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  X1 exercises given as well as referral for OP vestibular follow up.  DO NOT DO [place] ON THE R because pt will pass out due to insufficiency.  Balance is better, but would benefit from RW to help decrease fall risk at home.     Follow Up Recommendations  Outpatient PT;Other (comment);Supervision - Intermittent (vestibular therapy)     Equipment Recommendations  Rolling walker with 5" wheels    Recommendations for Other Services       Precautions / Restrictions Precautions Precautions: Fall Precaution Comments: light headed with head turns to R, unbalanced    Mobility  Bed Mobility Overal bed mobility: Independent                  Transfers Overall transfer level: Needs assistance   Transfers: Sit to/from Stand Sit to Stand: Supervision         General transfer comment: supervision for safety  Ambulation/Gait Ambulation/Gait assistance: Supervision Gait Distance (Feet): 15 Feet Assistive device: None Gait Pattern/deviations: Step-through pattern;Staggering right Gait velocity: slow (better than yesterday) Gait velocity interpretation: 1.31 - 2.62 ft/sec, indicative of limited community  ambulator General Gait Details: Pulls to the right, slow turns (encouraged, explained segmental turning).   Stairs Stairs: Yes       General stair comments: educated on safer to use rail and do step to pattern on stairs.   Wheelchair Mobility    Modified Rankin (Stroke Patients Only)       Balance Overall balance assessment: Needs assistance Sitting-balance support: Feet supported;No upper extremity supported Sitting balance-Leahy Scale: Good     Standing balance support: No upper extremity supported Standing balance-Leahy Scale: Good           09/14/20 0001  Symptom Behavior  Subjective history of current problem Pt reports long standing history of turning his head to the right and blacking out.  He has been advised to just avoid turning his head to the right.  The acute dizziness he is having is a different sensation though, queasy, imbalance.  Type of Dizziness  Imbalance  Frequency of Dizziness near constant when moving  Duration of Dizziness long  Symptom Nature Motion provoked  Aggravating Factors Activity in general  Relieving Factors Lying supine;Slow movements  Progression of Symptoms Better  Oculomotor Exam  Oculomotor Alignment Normal  Spontaneous Absent  Gaze-induced  Right beating nystagmus with R gaze;Right beating nystagmus with L gaze  Smooth Pursuits Intact  Oculomotor Exam-Fixation Suppressed   Left Head Impulse positive  Right Head Impulse neg  Vestibulo-Ocular Reflex  VOR 1 Head Only (x 1 viewing) symptomatic with both vertical and horizontal, but good speed and good ability to maintain eyes on target.  Auditory  Comments grossly equal with finger rub test  Other  Tests  Comments negative valsava  Positional Testing  Dix-Hallpike  (was tested yesterday and R side produced near-syncope, MRA (+) for vertebral artery insufficiency)                        Cognition Arousal/Alertness: Awake/alert Behavior During Therapy: WFL for  tasks assessed/performed Overall Cognitive Status: Within Functional Limits for tasks assessed                                        Exercises      General Comments        Pertinent Vitals/Pain Pain Assessment: Faces Faces Pain Scale: No hurt    Home Living                      Prior Function            PT Goals (current goals can now be found in the care plan section) Acute Rehab PT Goals Patient Stated Goal: get better to move back to [place] Progress towards PT goals: Progressing toward goals    Frequency    Min 3X/week      PT Plan Current plan remains appropriate    Co-evaluation              AM-PAC PT "6 Clicks" Mobility   Outcome Measure  Help needed turning from your back to your side while in a flat bed without using bedrails?: None Help needed moving from lying on your back to sitting on the side of a flat bed without using bedrails?: None Help needed moving to and from a bed to a chair (including a wheelchair)?: A Little Help needed standing up from a chair using your arms (e.g., wheelchair or bedside chair)?: A Little Help needed to walk in hospital room?: A Little Help needed climbing 3-5 steps with a railing? : A Little 6 Click Score: 20    End of Session Equipment Utilized During Treatment: Gait belt Activity Tolerance: Patient tolerated treatment well Patient left: in bed;with call bell/phone within reach   PT Visit Diagnosis: Unsteadiness on feet (R26.81);Muscle weakness (generalized) (M62.81);Difficulty in walking, not elsewhere classified (R26.2);Dizziness and giddiness (R42)     Time: 1107-1200 PT Time Calculation (min) (ACUTE ONLY): 53 min  Charges:  $Therapeutic Exercise: 8-22 mins $Therapeutic Activity: 38-52 mins                     Verdene Lennert, PT, DPT  Acute Rehabilitation [phone] pager [phone]) [phone] office

## 2020-09-14 NOTE — Plan of Care (Signed)

## 2020-09-14 NOTE — Discharge Summary (Signed)
Physician Discharge Summary  Bryan Rios IOE:[id number] DOB: 01-05-1951 DOA: 09/12/2020  PCP: Vista Lawman A, DO  Admit date: 09/12/2020 Discharge date: 09/14/2020  Admitted From: home Disposition:  home  Recommendations for Outpatient Follow-up:  1. Follow up with PCP in 1-2 weeks 2. Please obtain BMP/CBC in one week 3. Follow up with Dr Kathyrn Sheriff in 2 weeks  Home Health: none Equipment/Devices: none  Discharge Condition: stable CODE STATUS: Full code Diet recommendation: heart healthy   HPI: Per admitting MD, FAUSTO SAMPEDRO is a 70 y.o. 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  On arrival he was bradycardic and hypertensive.  Labs showing WBC 5.4, hemoglobin 13.2, platelet count 187K.  Sodium 139, potassium 3.9, chloride 109, bicarb 22, BUN 25, creatinine 1.6 (at baseline), glucose 140.  Lipase and LFTs normal.  UA not suggestive of infection.  Lactate within normal range. Head CT negative for acute finding.  High-sensitivity troponin negative x2. EKG revealed new ST elevation in aVR and aVL with depressions and TWI inferiorly and laterally.  Patient was given aspirin, beta-blocker, and heparin bolus.  Code STEMI was activated and patient was transferred to [facility].  Upon arrival to the ED at [facility], EKG changes had resolved and patient denied chest pain.  He was seen by cardiology and code STEMI canceled.  Cardiology recommended Lexiscan nuclear stress test given risk factors for CAD and patient reporting an episode of exertional chest pain 2 days ago.  Repeat troponins remained negative.  CT abdomen pelvis showing no acute pathology.  Patient states he has felt dizzy for this past week.  Denies room spinning sensation.  He describes it as lightheadedness when going from  sitting to standing position.  Reports history of dizziness for several years especially when he turns his head to the right.  When he drives, he cannot turn his head to the right as it makes him dizzy so instead he moves his entire body.  Also reports ongoing diarrhea for this past week.  Denies any recent antibiotic use or travel.  He has not used any laxatives or stool softeners.  Denies abdominal pain.  States when he went to the emergency room he was feeling nauseous and vomited.  Denies any chest pain at present but does recall an episode of left-sided chest pain a few days ago which happened after he went to the gym.  Denies fevers, cough, or shortness of breath.  Hospital Course / Discharge diagnoses: Principal problem Diarrhea, nausea/vomiting - COVID and influenza PCR negative.  No fever or leukocytosis.  Lipase and LFTs normal.  UA not suggestive of infection.  CT abdomen pelvis showing no acute pathology.  Patient reports several weeks of intermittent GI upset, started about couple of weeks ago when he was eating some leftover food, had diarrhea and GI upset but improved.  He had a recurrent episode about a week ago somewhat similar in quality.  On the day of admission he went to an event where he ate hot dog, and then afterwards started feeling diaphoretic, dizzy, and had several episodes of vomiting followed by watery diarrhea.  This is likely food poisoning, completely resolved tolerating a regular diet and no longer has diarrhea  Active problems Chronic dizziness - Describes it as lightheadedness when going from sitting to standing position but also reports longstanding history of  dizziness when turning his head to the right.  This started around 2005.  An MRA of the head and neck showed dominant right vertebral artery and hypoplastic left vertebral artery.  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  Discussed with neurology.  Will refer to neurosurgery as an outpatient EKG changes -Initial EKG was  concerning for STEMI but repeat EKG showing resolution of these changes.  High-sensitivity troponin negative x4. Cardiology canceled code STEMI.  No chest pain at present but does report an episode of exertional chest pain a few days ago.  On the rhythm nuclear stress test which is negative for acute findings, deemed low risk CKD stage IIIb, history of renal transplant -Renal function at baseline. HTN -Continue home medications Hyperlipidemia -Continue Lipitor Insulin-dependent type 2 diabetes -Continue home regimen  Sepsis ruled out   Discharge Instructions   Allergies as of 09/14/2020      Reactions   Lisinopril Hives, Other (See Comments)   Weakness, shortness of breath LISINOPRIL Dermatological problems, e.g., rash, hives;--   Metformin Other (See Comments), Rash   weakness   Terbinafine Hcl Other (See Comments)   Can't breath, throat swelling   Amlodipine Other (See Comments)   Nifedipine Other (See Comments)   fatigue fatigue NIFEDIPINE dizziness--   Zocor [simvastatin]    Other reaction(s): Unknown   Diltiazem Hcl Other (See Comments)   fatigue fatigue      Medication List    TAKE these medications   acetaminophen 500 MG tablet Commonly known as: TYLENOL Take 500-1,000 mg by mouth 2 (two) times daily as needed for mild pain or headache.   aspirin EC 81 MG tablet Take 81 mg by mouth daily.   atorvastatin 40 MG tablet Commonly known as: LIPITOR Take 40 mg by mouth at bedtime.   cyclobenzaprine 10 MG tablet Commonly known as: FLEXERIL Take 1 tablet (10 mg total) by mouth 2 (two) times daily as needed for muscle spasms.   ergocalciferol 1.25 MG (50000 UT) capsule Commonly known as: VITAMIN D2 Take 50,000 Units by mouth every Sunday.   insulin aspart 100 UNIT/ML injection Commonly known as: novoLOG Inject 0-5 Units into the skin See admin instructions. Per sliding scale   insulin glargine 100 UNIT/ML injection Commonly known as: LANTUS Inject 15 Units  into the skin 2 (two) times daily.   irbesartan 300 MG tablet Commonly known as: AVAPRO Take 150 mg by mouth in the morning and at bedtime.   mycophenolate 180 MG EC tablet Commonly known as: MYFORTIC Take 360 mg by mouth 2 (two) times daily.   tacrolimus 0.5 MG capsule Commonly known as: PROGRAF Take 1-1.5 mg by mouth See admin instructions. 1.5 mg in the morning and 1 mg at bedtime       Follow-up Information    Consuella Lose, MD. Schedule an appointment as soon as possible for a visit in 2 week(s).   Specialty: Neurosurgery Contact information: [address] [place] [address] [place] [address] [phone]               Consultations:  None   Procedures/Studies:  CT ABDOMEN PELVIS WO CONTRAST  Result Date: 09/13/2020 CLINICAL DATA:  Dizziness and diarrhea x1 week EXAM: CT ABDOMEN AND PELVIS WITHOUT CONTRAST TECHNIQUE: Multidetector CT imaging of the abdomen and pelvis was performed following the standard protocol without IV contrast. COMPARISON:  None. FINDINGS: Lower chest: Bibasilar atelectasis. Normal size heart. No significant pericardial effusion/thickening. Hepatobiliary: Unremarkable noncontrast appearance of the hepatic parenchyma. Gallbladder is mildly distended  but otherwise unremarkable. No biliary ductal dilation. Pancreas: Within normal limits. Spleen: Within normal limits. Adrenals/Urinary Tract: Bilateral adrenal glands are unremarkable. The bilateral native kidneys are atrophic. Left lower quadrant transplant kidney without hydronephrosis. No renal, ureteral or bladder calculi visualized. Urinary bladder is grossly unremarkable for degree of distension. Stomach/Bowel: Stomach is grossly unremarkable. No pathologic dilation of small bowel. Normal appendix. Colonic diverticulosis without findings of acute diverticulitis. Vascular/Lymphatic: Aortic atherosclerosis. No enlarged abdominal or pelvic lymph nodes. Reproductive: Prostate is unremarkable.   Penile prosthesis. Other: No abdominopelvic ascites or pneumoperitoneum Musculoskeletal: No acute osseous abnormality IMPRESSION: 1. No acute findings in the abdomen or pelvis. 2. Colonic diverticulosis without findings of acute diverticulitis. 3. Left lower quadrant transplant kidney without hydronephrosis. 4. Aortic atherosclerosis. Aortic Atherosclerosis (ICD10-I70.0). Electronically Signed   By: Dahlia Bailiff MD   On: 09/13/2020 02:05   CT Head Wo Contrast  Result Date: 09/12/2020 CLINICAL DATA:  Dizziness EXAM: CT HEAD WITHOUT CONTRAST TECHNIQUE: Contiguous axial images were obtained from the base of the skull through the vertex without intravenous contrast. COMPARISON:  None. FINDINGS: Brain: There is no mass, hemorrhage or extra-axial collection. The size and configuration of the ventricles and extra-axial CSF spaces are normal. There is hypoattenuation of the white matter, most commonly indicating chronic small vessel disease. Vascular: No abnormal hyperdensity of the major intracranial arteries or dural venous sinuses. No intracranial atherosclerosis. Skull: The visualized skull base, calvarium and extracranial soft tissues are normal. Sinuses/Orbits: No fluid levels or advanced mucosal thickening of the visualized paranasal sinuses. No mastoid or middle ear effusion. The orbits are normal. IMPRESSION: Chronic small vessel disease without acute intracranial abnormality. Electronically Signed   By: Ulyses Jarred M.D.   On: 09/12/2020 22:07   MR ANGIO HEAD WO CONTRAST  Result Date: 09/14/2020 CLINICAL DATA:  Dizziness, persistent/recurrent, cardiac or vascular cause suspected. EXAM: MRA NECK WITHOUT CONTRAST MRA HEAD WITHOUT CONTRAST TECHNIQUE: Angiographic images of the neck were obtained using MRA technique without intravenous contrast; Angiographic images of the Circle of Willis were obtained using MRA technique without intravenous contrast. COMPARISON:  MRI of the brain Sep 13, 2020. FINDINGS:  MRA NECK FINDINGS Aortic arch: Normal 3 vessel aortic branching pattern. The visualized subclavian arteries are normal. Right carotid system: Normal course and caliber without stenosis or evidence of dissection. Left carotid system: Normal course and caliber without stenosis or evidence of dissection. Vertebral arteries: Right dominant. Vertebral artery origins are normal. The left vertebral artery is hypoplastic throughout its entire length and ends in PICA. Focal area of stenosis in the V2 segment of the right vertebral artery measuring up to 60% stenosis when measured on the source images (series 6, image 83). MRA HEAD FINDINGS The visualized portions of the distal cervical and intracranial internal carotid arteries are widely patent with normal flow related enhancement. The bilateral anterior cerebral arteries and middle cerebral arteries are widely patent with antegrade flow without high-grade flow-limiting stenosis or proximal branch occlusion. No intracranial aneurysm within the anterior circulation. The vertebral arteries are patent with antegrade flow. The right vertebral artery is dominant. The left vertebral artery ends in PICA. Basilar artery is widely patent with antegrade flow without evidence of basilar stenosis or aneurysm. Posterior cerebral arteries are normal bilaterally. No intracranial aneurysm within the posterior circulation. IMPRESSION: 1. 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 2. Hypoplastic left vertebral artery. Electronically Signed   By: Jeanella Craze  Debbrah Alar M.D.   On: 09/14/2020 11:11   MR ANGIO NECK WO CONTRAST  Result Date: 09/14/2020 CLINICAL DATA:  Dizziness, persistent/recurrent, cardiac or vascular cause suspected. EXAM: MRA NECK WITHOUT CONTRAST MRA HEAD WITHOUT CONTRAST TECHNIQUE: Angiographic images of the neck were obtained using MRA technique without intravenous contrast;  Angiographic images of the Circle of Willis were obtained using MRA technique without intravenous contrast. COMPARISON:  MRI of the brain Sep 13, 2020. FINDINGS: MRA NECK FINDINGS Aortic arch: Normal 3 vessel aortic branching pattern. The visualized subclavian arteries are normal. Right carotid system: Normal course and caliber without stenosis or evidence of dissection. Left carotid system: Normal course and caliber without stenosis or evidence of dissection. Vertebral arteries: Right dominant. Vertebral artery origins are normal. The left vertebral artery is hypoplastic throughout its entire length and ends in PICA. Focal area of stenosis in the V2 segment of the right vertebral artery measuring up to 60% stenosis when measured on the source images (series 6, image 83). MRA HEAD FINDINGS The visualized portions of the distal cervical and intracranial internal carotid arteries are widely patent with normal flow related enhancement. The bilateral anterior cerebral arteries and middle cerebral arteries are widely patent with antegrade flow without high-grade flow-limiting stenosis or proximal branch occlusion. No intracranial aneurysm within the anterior circulation. The vertebral arteries are patent with antegrade flow. The right vertebral artery is dominant. The left vertebral artery ends in PICA. Basilar artery is widely patent with antegrade flow without evidence of basilar stenosis or aneurysm. Posterior cerebral arteries are normal bilaterally. No intracranial aneurysm within the posterior circulation. IMPRESSION: 1. 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 2. Hypoplastic left vertebral artery. Electronically Signed   By: Pedro Earls M.D.   On: 09/14/2020 11:11   MR BRAIN WO CONTRAST  Result Date: 09/13/2020 CLINICAL DATA:  71 year old male with possible STEMI. Dizziness. Vomiting. EXAM: MRI HEAD  WITHOUT CONTRAST TECHNIQUE: Multiplanar, multiecho pulse sequences of the brain and surrounding structures were obtained without intravenous contrast. COMPARISON:  Head CT 09/12/2020. FINDINGS: Brain: No restricted diffusion to suggest acute infarction. No midline shift, mass effect, evidence of mass lesion, ventriculomegaly, extra-axial collection or acute intracranial hemorrhage. Cervicomedullary junction and pituitary are within normal limits. Patchy mild to moderate for age bilateral cerebral white matter T2 and FLAIR hyperintensity, mostly periventricular. No cortical encephalomalacia identified. No convincing chronic cerebral blood products. Deep gray matter nuclei, brainstem and cerebellum are within normal limits for age. Vascular: Major intracranial vascular flow voids are preserved. Dominant right vertebral artery. Skull and upper cervical spine: Chronic C3-C4 disc and endplate degeneration. Normal background bone marrow signal. Sinuses/Orbits: Negative orbits soft tissues. Trace paranasal sinus mucosal thickening. Other: Mastoid air cells are clear. Visible internal auditory structures appear normal. Visible stylomastoid foramina and parotid glands appear within normal limits. IMPRESSION: 1. No acute intracranial abnormality. 2. Mild to moderate for age nonspecific cerebral white matter signal changes, most commonly due to chronic small vessel disease. Electronically Signed   By: Genevie Ann M.D.   On: 09/13/2020 06:12   NM Myocar Multi W/Spect Tamela Oddi Motion / EF  Result Date: 09/13/2020 CLINICAL DATA:  Chest pain. EXAM: MYOCARDIAL IMAGING WITH SPECT (REST AND PHARMACOLOGIC-STRESS) GATED LEFT VENTRICULAR WALL MOTION STUDY LEFT VENTRICULAR EJECTION FRACTION TECHNIQUE: Standard myocardial SPECT imaging was performed after resting intravenous injection of 10 mCi Tc-60m tetrofosmin. Subsequently, intravenous infusion of Lexiscan was performed under the supervision of  the Cardiology staff. At peak effect of  the drug, 30 mCi Tc-63m tetrofosmin was injected intravenously and standard myocardial SPECT imaging was performed. Quantitative gated imaging was also performed to evaluate left ventricular wall motion, and estimate left ventricular ejection fraction. COMPARISON:  None. FINDINGS: Perfusion: Diaphragmatic attenuation of the inferior wall and mild apical thinning but no fixed or reversible perfusion defects to suggest infarction or ischemia. Wall Motion: Normal left ventricular wall motion. No left ventricular dilation. Left Ventricular Ejection Fraction: 52 % End diastolic volume 123456 ml End systolic volume 51 ml IMPRESSION: 1. No reversible ischemia or infarction. 2. Normal left ventricular wall motion. 3. Left ventricular ejection fraction 52% 4. Non invasive risk stratification*: Low *2012 Appropriate Use Criteria for Coronary Revascularization Focused Update: J Am Coll Cardiol. [id number]. http://content.airportbarriers.com.aspx?articleid=[email or web address] Electronically Signed   By: Marijo Sanes M.D.   On: 09/13/2020 15:12     Subjective: - no chest pain, shortness of breath, no abdominal pain, nausea or vomiting.   Discharge Exam: BP 136/60 (BP Location: Right Arm)   Pulse (!) 48   Temp 98.2 F (36.8 C) (Oral)   Resp 17   Ht 5\' 11"  (1.803 m)   Wt 84.5 kg   SpO2 99%   BMI 25.98 kg/m   General: Pt is alert, awake, not in acute distress Cardiovascular: RRR, S1/S2 +, no rubs, no gallops Respiratory: CTA bilaterally, no wheezing, no rhonchi Abdominal: Soft, NT, ND, bowel sounds + Extremities: no edema, no cyanosis  The results of significant diagnostics from this hospitalization (including imaging, microbiology, ancillary and laboratory) are listed below for reference.     Microbiology: Recent Results (from the past 240 hour(s))  Resp Panel by RT-PCR (Flu A&B, Covid) Nasopharyngeal Swab     Status: None   Collection Time: 09/12/20  2:51 AM   Specimen: Nasopharyngeal Swab;  Nasopharyngeal(NP) swabs in vial transport medium  Result Value Ref Range Status   SARS Coronavirus 2 by RT PCR NEGATIVE NEGATIVE Final    Comment: (NOTE) SARS-CoV-2 target nucleic acids are NOT DETECTED.  The SARS-CoV-2 RNA is generally detectable in upper respiratory specimens during the acute phase of infection. The lowest concentration of SARS-CoV-2 viral copies this assay can detect is 138 copies/mL. A negative result does not preclude SARS-Cov-2 infection and should not be used as the sole basis for treatment or other patient management decisions. A negative result may occur with  improper specimen collection/handling, submission of specimen other than nasopharyngeal swab, presence of viral mutation(s) within the areas targeted by this assay, and inadequate number of viral copies(<138 copies/mL). A negative result must be combined with clinical observations, patient history, and epidemiological information. The expected result is Negative.  Fact Sheet for Patients:  EntrepreneurPulse.com.au  Fact Sheet for Healthcare Providers:  IncredibleEmployment.be  This test is no t yet approved or cleared by the [place] FDA and  has been authorized for detection and/or diagnosis of SARS-CoV-2 by FDA under an Emergency Use Authorization (EUA). This EUA will remain  in effect (meaning this test can be used) for the duration of the COVID-19 declaration under Section 564(b)(1) of the Act, 21 U.S.C.section 360bbb-3(b)(1), unless the authorization is terminated  or revoked sooner.       Influenza A by PCR NEGATIVE NEGATIVE Final   Influenza B by PCR NEGATIVE NEGATIVE Final    Comment: (NOTE) The Xpert Xpress SARS-CoV-2/FLU/RSV plus assay is intended as an aid in the diagnosis of influenza from Nasopharyngeal swab specimens and should not be used as  a sole basis for treatment. Nasal washings and aspirates are unacceptable for Xpert Xpress  SARS-CoV-2/FLU/RSV testing.  Fact Sheet for Patients: EntrepreneurPulse.com.au  Fact Sheet for Healthcare Providers: IncredibleEmployment.be  This test is not yet approved or cleared by the [place] FDA and has been authorized for detection and/or diagnosis of SARS-CoV-2 by FDA under an Emergency Use Authorization (EUA). This EUA will remain in effect (meaning this test can be used) for the duration of the COVID-19 declaration under Section 564(b)(1) of the Act, 21 U.S.C. section 360bbb-3(b)(1), unless the authorization is terminated or revoked.  Performed at [facility], [address] [place], [place] [address]      Labs: Basic Metabolic Panel: Recent Labs  Lab 09/12/20 1817 09/14/20 0700  NA 139 139  K 3.9 4.2  CL 109 108  CO2 22 25  GLUCOSE 140* 84  BUN 25* 24*  CREATININE 1.68* 1.79*  CALCIUM 9.5 9.2   Liver Function Tests: Recent Labs  Lab 09/12/20 1817  AST 20  ALT 15  ALKPHOS 54  BILITOT 0.4  PROT 7.9  ALBUMIN 4.6   CBC: Recent Labs  Lab 09/12/20 1817  WBC 5.4  HGB 13.2  HCT 41.4  MCV 83.1  PLT 187   CBG: Recent Labs  Lab 09/13/20 1057 09/13/20 1636 09/13/20 2115 09/14/20 0609 09/14/20 1126  GLUCAP 136* 113* 210* 101* 144*   Hgb A1c Recent Labs    09/12/20 2249  HGBA1C 7.4*   Lipid Profile Recent Labs    09/12/20 2255  CHOL 111  HDL 41  LDLCALC 58  TRIG 59  CHOLHDL 2.7   Thyroid function studies Recent Labs    09/13/20 0735  TSH 1.608   Urinalysis    Component Value Date/Time   COLORURINE YELLOW 09/12/2020 2118   APPEARANCEUR CLEAR 09/12/2020 2118   LABSPEC 1.020 09/12/2020 2118   PHURINE 7.0 09/12/2020 2118   GLUCOSEU NEGATIVE 09/12/2020 2118   HGBUR NEGATIVE 09/12/2020 2118   [place] NEGATIVE 09/12/2020 2118   [place] NEGATIVE 09/12/2020 2118   PROTEINUR 30 (A) 09/12/2020 2118   NITRITE NEGATIVE 09/12/2020 2118   LEUKOCYTESUR NEGATIVE 09/12/2020  2118    FURTHER DISCHARGE INSTRUCTIONS:   Get Medicines reviewed and adjusted: Please take all your medications with you for your next visit with your Primary MD   Laboratory/radiological data: Please request your Primary MD to go over all hospital tests and procedure/radiological results at the follow up, please ask your Primary MD to get all Hospital records sent to his/her office.   In some cases, they will be blood work, cultures and biopsy results pending at the time of your discharge. Please request that your primary care M.D. goes through all the records of your hospital data and follows up on these results.   Also Note the following: If you experience worsening of your admission symptoms, develop shortness of breath, life threatening emergency, suicidal or homicidal thoughts you must seek medical attention immediately by calling 911 or calling your MD immediately  if symptoms less severe.   You must read complete instructions/literature along with all the possible adverse reactions/side effects for all the Medicines you take and that have been prescribed to you. Take any new Medicines after you have completely understood and accpet all the possible adverse reactions/side effects.    Do not drive when taking Pain medications or sleeping medications (Benzodaizepines)   Do not take more than prescribed Pain, Sleep and Anxiety Medications. It is not advisable to combine  anxiety,sleep and pain medications without talking with your primary care practitioner   Special Instructions: If you have smoked or chewed Tobacco  in the last 2 yrs please stop smoking, stop any regular Alcohol  and or any Recreational drug use.   Wear Seat belts while driving.   Please note: You were cared for by a hospitalist during your hospital stay. Once you are discharged, your primary care physician will handle any further medical issues. Please note that NO REFILLS for any discharge medications will be  authorized once you are discharged, as it is imperative that you return to your primary care physician (or establish a relationship with a primary care physician if you do not have one) for your post hospital discharge needs so that they can reassess your need for medications and monitor your lab values.  Time coordinating discharge: 40 minutes  SIGNED:  Marzetta Board, MD, PhD 09/14/2020, 11:43 AM

## 2020-09-14 NOTE — Progress Notes (Signed)
Nuclear stress test 09/13/2020: 1. No reversible ischemia or infarction. 2. Normal left ventricular wall motion. 3. Left ventricular ejection fraction 52% 4. Non invasive risk stratification*: Low  *2012 Appropriate Use Criteria for Coronary Revascularization Focused Update: J Am Coll Cardiol. 2012;59(9):857-881. Http://content.airportbarriers.com.aspx?articleid=1201161  No indication for invasive workup at this time. Continue management as per the primary team for GI complaints.  Cardiology signing off. Please call us back if any questions.   Nigel Mormon, MD Pager: [phone] Office: [phone]

## 2020-09-21 ENCOUNTER — Encounter: Payer: Self-pay | Admitting: Physical Therapy

## 2020-09-21 ENCOUNTER — Ambulatory Visit: Payer: HMO | Attending: Internal Medicine | Admitting: Physical Therapy

## 2020-09-21 ENCOUNTER — Other Ambulatory Visit: Payer: Self-pay

## 2020-09-21 DIAGNOSIS — R2681 Unsteadiness on feet: Secondary | ICD-10-CM | POA: Insufficient documentation

## 2020-09-21 DIAGNOSIS — R42 Dizziness and giddiness: Secondary | ICD-10-CM | POA: Diagnosis present

## 2020-09-21 NOTE — Therapy (Signed)
[facility] [address]  [place], [place], [address] Phone: [phone]   Fax:  [phone]  Physical Therapy Evaluation  Patient Details  Name: Bryan Rios MRN: [id number] Date of Birth: 20-Mar-1951 Referring Provider (PT): Marzetta Board, MD   Encounter Date: 09/21/2020   PT End of Session - 09/21/20 1621    Visit Number 1    Number of Visits 13    Date for PT Re-Evaluation 11/02/20    Authorization Type HT advantage    PT Start Time 1448    PT Stop Time 1529    PT Time Calculation (min) 41 min    Equipment Utilized During Treatment Gait belt    Activity Tolerance Patient tolerated treatment well;Patient limited by pain    Behavior During Therapy [facility] for tasks assessed/performed           Past Medical History:  Diagnosis Date  . Diabetes mellitus without complication ([place])   . Hypertension   . Renal disorder     Past Surgical History:  Procedure Laterality Date  . KIDNEY TRANSPLANT      There were no vitals filed for this visit.    Subjective Assessment - 09/21/20 1451    Subjective Patient reports that he had eaten something that had not agreed with him a couple weeks before his hospitalization on 05/16-05/18 for vertigo. 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 Dizziness occurs when he stands up and starts to walk, feels like he veers to the R/L. Feels like imbalance. Does not feel as much dizziness since leaving the hospital. Was DC'd with walker which he has not used since he came home. Denies hearing loss, tinnitus, otorrhea, head trauma. Dizziness lasts seconds. Banged his R medial knee on something a couple days ago and now having trouble walking which is why he is in a transport chair. Saw his PCP about it this AM, but declined xrays. Will be f/u with neurology soon.     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 Yes    Pain Score 3     Pain Location Knee    Pain Orientation Medial;Right    Pain Descriptors / Indicators Throbbing    Pain Type Acute pain              [facility] PT Assessment - 09/21/20 1458      Assessment   Medical Diagnosis Dizziness    Referring Provider (PT) Marzetta Board, MD    Onset Date/Surgical Date 09/12/20    Next MD Visit not scheduled    Prior Therapy yes for shoulder      Precautions   Precautions Fall   no end range R head turn d/t vertebral insufficiency     Balance Screen   Has the patient fallen in the past 6 months No    Has the patient had a decrease in activity level because of a  fear of falling?  Yes    Is the patient reluctant to leave their home because of a fear of falling?  No      Home Environment   Living Environment Private residence    Living Arrangements Non-relatives/Friends    Available Help at Discharge Friend(s)    Type of [place] to enter    Entrance Stairs-Number of Steps [place] One level    [place] - 2 wheels      Prior Function   Level of [place] Retired    Leisure basketball      Cognition   Overall Cognitive Status Within Functional Limits for tasks assessed      Observation/Other Assessments   Observations sitting in transport chair w/ R weight shift      Ambulation/Gait   Assistive device None     Gait Pattern Step-through pattern;Step-to pattern;Decreased stance time - right;Decreased step length - left;Antalgic;Trunk flexed;Decreased hip/knee flexion - right    Ambulation Surface Level;Indoor    Gait Comments performed gait training with SPC x59ft with cueing for SPC sequencing; patient still unstable, thus recommended RW use      Standardized Balance Assessment   Standardized Balance Assessment Timed Up and Go Test;Five Times Sit to Stand   MCTSIB: EO/firm- mild sway, EC/firm- severe sway posterolateral   Five times sit to stand comments  32.62 sec   pushing off knees     Timed Up and Go Test   TUG Normal TUG    Normal TUG (seconds) 21.18   no AD                 Vestibular Assessment - 09/21/20 1502      Symptom Behavior   Type of Dizziness  Imbalance    Frequency of Dizziness daily    Symptom Nature Motion provoked    Aggravating Factors --   moving quickly, "feeling like I have to catch up"   Relieving Factors Lying supine;Slow movements    Progression of Symptoms Better      Oculomotor Exam   Oculomotor Alignment Normal   L eyelid droop   Ocular ROM WFL    Spontaneous Absent    Gaze-induced  Right beating nystagmus with R gaze;Right beating nystagmus with L gaze    Smooth Pursuits Intact    Saccades Overshoots      Oculomotor Exam-Fixation Suppressed    Left Head Impulse slightly positive    Right Head Impulse WFL      Vestibulo-Ocular Reflex   VOR 1 Head Only (x 1 viewing) c/o 4/10 dizziness within limited ROM but good speed horizontal and vertical    VOR Cancellation Normal              Objective measurements completed on examination: See above findings.               PT Education - 09/21/20 1621    Education Details prognosis, POC, HEP; recommended RW use for max safety    Person(s) Educated Patient    Methods Explanation;Demonstration;Tactile cues;Verbal cues;Handout    Comprehension Verbalized understanding;Returned  demonstration            PT Short Term Goals - 09/21/20 1743      PT SHORT TERM GOAL #1   Title Patient to be independent with initial HEP.    Time 3  Period Weeks    Status New    Target Date 10/12/20             PT Long Term Goals - 09/21/20 1743      PT LONG TERM GOAL #1   Title Patient to be independent with advanced HEP.    Time 6    Period Weeks    Status New    Target Date 11/02/20      PT LONG TERM GOAL #2   Title Patient to score <14 sec on TUG testing with LRAD in order to decrease risk of falls.    Time 6    Period Weeks    Status New    Target Date 11/02/20      PT LONG TERM GOAL #3   Title Patient to demonstrate 5xSTS in <12 sec in order to decrease risk of falls.    Time 6    Period Weeks    Status New    Target Date 11/02/20      PT LONG TERM GOAL #4   Title Patient to report 0/10 dizziness with standing VOR vertical and horizontal.    Time 6    Period Weeks    Status New    Target Date 11/02/20      PT LONG TERM GOAL #5   Title Patient to score atleast 20/24 on DGI in order to decrease risk of falls.    Time 6    Period Weeks    Status New    Target Date 11/02/20                  Plan - 09/21/20 1714    Clinical Impression Statement Patient is a 70 y/o M presenting to [facility] with c/o acute on chronic dizziness since hospitalization 09/12/20-09/14/20 for dizziness, nausea, vomiting, and diarrhea. 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 Upon PT assessment in the hospital, patient with near syncope with R DH d/t vertebral insufficiency. Patient today presenting in transport chair d/t R knee pain rather than imbalance. Oculomotor exam revealed R beating nystagmus with R and L gaze, overshooting with horizontal and vertical saccades, slightly positive L HIT, and dizziness  with vertical and horizontal VOR. Patient requires increased time and demonstrates instability with transfers and gait. Thus ultimately still recommend RW use. Patient was educated on balance and VOR HEP and educated on safety- patient reported understanding. Would benefit from skilled PT services 2x/week for 6 weeks to address aforementioned impairments.    Personal Factors and Comorbidities Age;Comorbidity 3+;Fitness;Past/Current Experience;Time since onset of injury/illness/exacerbation    Comorbidities renal disorder, HTN, DM, kidney transplant    Examination-Activity Limitations Bathing;Bend;Carry;Dressing;Hygiene/Grooming;Lift;Locomotion Level;Reach Overhead;Transfers;Toileting;Stand;Stairs;Squat;Sleep;Sit;Bed Mobility    Examination-Participation Restrictions Church;Cleaning;Community Activity;Driving;Laundry;Meal Prep;Shop    Stability/Clinical Decision Making Stable/Uncomplicated    Clinical Decision Making Low    Rehab Potential Good    PT Frequency 2x / week    PT Duration 6 weeks    PT Treatment/Interventions ADLs/Self Care Home Management;Canalith Repostioning;Cryotherapy;Moist Heat;Balance training;Therapeutic exercise;Therapeutic activities;Functional mobility training;Stair training;Gait training;DME Instruction;Neuromuscular re-education;Patient/family education;Manual techniques;Vestibular;Taping;Energy conservation;Dry needling;Passive range of motion    PT Next Visit Plan reassess HEP; gait training with RW, progress VOR training    Consulted and Agree with Plan of Care Patient           Patient will benefit from skilled therapeutic intervention in order to improve the following  deficits and impairments:  Abnormal gait,Decreased activity tolerance,Decreased strength,Increased fascial restricitons,Decreased balance,Difficulty walking,Improper body mechanics,Dizziness,Impaired flexibility,Postural dysfunction  Visit Diagnosis: Dizziness and giddiness  Unsteadiness on  feet     Problem List Patient Active Problem List   Diagnosis Date Noted  . Dizziness 09/13/2020  . Vomiting 09/13/2020  . Diarrhea 09/13/2020  . Abnormal EKG 09/13/2020  . Bradycardia 09/13/2020    Janene Harvey, PT, DPT 09/21/20 5:54 PM   [facility] [address]  [place], [place], [address] Phone: [phone]   Fax:  [phone]  Name: OSINACHI NAVARRETTE MRN: [id number] Date of Birth: [birth year]

## 2020-09-28 ENCOUNTER — Telehealth: Payer: Self-pay

## 2020-09-28 NOTE — Patient Outreach (Addendum)
Bryan Rios received an [employer] call.  He has [employer] and is "already hooked up with them."

## 2020-10-03 ENCOUNTER — Ambulatory Visit: Payer: HMO | Attending: Internal Medicine | Admitting: Physical Therapy

## 2020-10-03 ENCOUNTER — Encounter: Payer: Self-pay | Admitting: Physical Therapy

## 2020-10-03 ENCOUNTER — Other Ambulatory Visit: Payer: Self-pay

## 2020-10-03 DIAGNOSIS — R2681 Unsteadiness on feet: Secondary | ICD-10-CM | POA: Diagnosis present

## 2020-10-03 DIAGNOSIS — R42 Dizziness and giddiness: Secondary | ICD-10-CM

## 2020-10-03 NOTE — Therapy (Signed)
[facility] [address]  [place], [place], [address] Phone: [phone]   Fax:  [phone]  Physical Therapy Treatment  Patient Details  Name: GUMECINDO HOPKIN MRN: [id number] Date of Birth: 28-Jul-1950 Referring Provider (PT): Marzetta Board, MD   Encounter Date: 10/03/2020   PT End of Session - 10/03/20 1605    Visit Number 2    Number of Visits 13    Date for PT Re-Evaluation 11/02/20    Authorization Type HT advantage    PT Start Time 1517    PT Stop Time 1557    PT Time Calculation (min) 40 min    Equipment Utilized During Treatment Gait belt    Activity Tolerance Patient tolerated treatment well    Behavior During Therapy [facility] for tasks assessed/performed           Past Medical History:  Diagnosis Date  . Diabetes mellitus without complication ([place])   . Hypertension   . Renal disorder     Past Surgical History:  Procedure Laterality Date  . KIDNEY TRANSPLANT      There were no vitals filed for this visit.   Subjective Assessment - 10/03/20 1518    Subjective Reports that his R knee is feeling much better- got an x-ray done which did not show anything. Has been using his SPC inside- not always. Reports intermittent compliance with HEP and reports improved dizziness.    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 No/denies              [facility] PT Assessment - 10/03/20 0001      ROM / Strength   AROM / PROM / Strength AROM;Strength      AROM   AROM Assessment Site Ankle    Right/Left Ankle  Right;Left    Right Ankle Dorsiflexion -1    Left Ankle Dorsiflexion 8      Strength   Strength Assessment Site Ankle    Right/Left Ankle Right;Left    Right Ankle Dorsiflexion 4+/5    Left Ankle Dorsiflexion 4+/5      Standardized Balance Assessment   Standardized Balance Assessment Dynamic Gait Index      Dynamic Gait Index   Level Surface Normal    Change in Gait Speed Normal    Gait with Horizontal Head Turns Normal    Gait with Vertical Head Turns Normal    Gait and Pivot Turn Normal    Step Over Obstacle Mild Impairment    Step Around Obstacles Normal    Steps Mild Impairment   slight instability descending, but performing alternating reciprocal pattern without handrail   Total Score 22                         [facility] Adult PT Treatment/Exercise - 10/03/20 0001      Ambulation/Gait   Ambulation Distance (Feet) 90 Feet    Assistive device Straight cane    Gait Pattern Step-through pattern;Step-to pattern;Decreased stance time - right;Decreased step length - left;Trunk flexed;Poor foot clearance - right;Poor foot clearance -  left   B feet dragging slightly   Gait Comments good SPC squencing and stability with [facility]      Exercises   Exercises Ankle      Ankle Exercises: Stretches   Gastroc Stretch 1 rep;30 seconds    Gastroc Stretch Limitations runner's stretch, B           Vestibular Treatment/Exercise - 10/03/20 0001      Vestibular Treatment/Exercise   Gaze Exercises X1 Viewing Horizontal;X1 Viewing Vertical      X1 Viewing Horizontal   Foot Position sitting, standing    Reps 10   10x each; 3rd set VOR cancellation in standing; 4th set romberg + VOR   Comments cueing to slightly increase ROM and decrease speed      X1 Viewing Vertical   Foot Position sitting, standing    Reps 10   3rd set romberg + VOR             Balance Exercises - 10/03/20 0001      Balance Exercises: Standing   Standing Eyes Opened Narrow base of support  (BOS);Solid surface;30 secs;Foam/compliant surface;2 reps   WFL sway firm, mild sway foam   Standing Eyes Closed Solid surface;30 secs;2 reps;Foam/compliant surface   mild sway firm, severe sway/LOB foam            PT Education - 10/03/20 1605    Education Details update to HEP; adjustment of SPC height for max comfort    Person(s) Educated Patient    Methods Explanation;Demonstration;Tactile cues;Verbal cues;Handout    Comprehension Verbalized understanding;Returned demonstration            PT Short Term Goals - 10/03/20 1606      PT SHORT TERM GOAL #1   Title Patient to be independent with initial HEP.    Time 3    Period Weeks    Status Achieved    Target Date 10/12/20             PT Long Term Goals - 10/03/20 1606      PT LONG TERM GOAL #1   Title Patient to be independent with advanced HEP.    Time 6    Period Weeks    Status On-going      PT LONG TERM GOAL #2   Title Patient to score <14 sec on TUG testing with LRAD in order to decrease risk of falls.    Time 6    Period Weeks    Status On-going      PT LONG TERM GOAL #3   Title Patient to demonstrate 5xSTS in <12 sec in order to decrease risk of falls.    Time 6    Period Weeks    Status On-going      PT LONG TERM GOAL #4   Title Patient to report 0/10 dizziness with standing VOR vertical and horizontal.    Time 6    Period Weeks    Status On-going      PT LONG TERM GOAL #5   Title Patient to score atleast 20/24 on DGI in order to decrease risk of falls.    Time 6    Period Weeks    Status Achieved   10/03/20 22/24                Plan - 10/03/20 1606    Clinical Impression Statement Patient able to demonstrate VOR training in standing and sitting without c/o dizziness. Provided narrow BOS challenges with increased  sway demonstrated. Patient demonstrated improved stability with static Romberg stance on firm surface; able to progress to foam surface with more challenge and imbalance. Gait  training with [place] revealed much improved stability compared to previous session and good SPC sequencing, however with poor clearance, which was addressed with calf stretching.  Patient scored 22/24 on DGI, indicating decreased risk of falls. Patient appears much more stable with ambulation and balance exercises today. Advised him to continue using SPC out in the community, as this seems to be the most appropriate AD for him at this time. Updated HEP with exercises that were well-tolerated today. Patient reported understanding and without complaints at end of session. Patient is progressing well towards goals. Would benefit from additional skilled PT services to return to PLOF.    Comorbidities renal disorder, HTN, DM, kidney transplant    PT Treatment/Interventions ADLs/Self Care Home Management;Canalith Repostioning;Cryotherapy;Moist Heat;Balance training;Therapeutic exercise;Therapeutic activities;Functional mobility training;Stair training;Gait training;DME Instruction;Neuromuscular re-education;Patient/family education;Manual techniques;Vestibular;Taping;Energy conservation;Dry needling;Passive range of motion    PT Next Visit Plan higher level balance training, progress VOR training, calf stretching    Consulted and Agree with Plan of Care Patient           Patient will benefit from skilled therapeutic intervention in order to improve the following deficits and impairments:  Abnormal gait,Decreased activity tolerance,Decreased strength,Increased fascial restricitons,Decreased balance,Difficulty walking,Improper body mechanics,Dizziness,Impaired flexibility,Postural dysfunction  Visit Diagnosis: Dizziness and giddiness  Unsteadiness on feet     Problem List Patient Active Problem List   Diagnosis Date Noted  . Dizziness 09/13/2020  . Vomiting 09/13/2020  . Diarrhea 09/13/2020  . Abnormal EKG 09/13/2020  . Bradycardia 09/13/2020     Janene Harvey, PT, DPT 10/03/20 4:09  PM   [facility] [address]  [place], [place], [address] Phone: [phone]   Fax:  [phone]  Name: KHYE HOCHSTETLER MRN: [id number] Date of Birth: April 09, 1951

## 2020-10-06 ENCOUNTER — Other Ambulatory Visit: Payer: Self-pay

## 2020-10-06 ENCOUNTER — Ambulatory Visit: Payer: HMO

## 2020-10-06 DIAGNOSIS — R42 Dizziness and giddiness: Secondary | ICD-10-CM | POA: Diagnosis not present

## 2020-10-06 DIAGNOSIS — R2681 Unsteadiness on feet: Secondary | ICD-10-CM

## 2020-10-06 NOTE — Therapy (Addendum)
[facility] [address] [place] [place], [place], [address] Phone: [phone]   Fax:  [phone]  Physical Therapy Treatment  Patient Details  Name: Bryan Rios MRN: [id number] Date of Birth: November 10, 1950 Referring Provider (PT): Marzetta Board, MD   Encounter Date: 10/06/2020   PT End of Session - 10/06/20 1657     Visit Number 3    Number of Visits 13    Date for PT Re-Evaluation 11/02/20    Authorization Type HT advantage    PT Start Time 1610    PT Stop Time 1656    PT Time Calculation (min) 46 min    Activity Tolerance Patient tolerated treatment well    Behavior During Therapy [facility] for tasks assessed/performed             Past Medical History:  Diagnosis Date   Diabetes mellitus without complication ([place])    Hypertension    Renal disorder     Past Surgical History:  Procedure Laterality Date   KIDNEY TRANSPLANT      There were no vitals filed for this visit.   Subjective Assessment - 10/06/20 1615     Subjective Pt reports balance is his biggest problem, dizziness is gone.    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 No/denies                               [facility] Adult PT Treatment/Exercise - 10/06/20 0001       Ambulation/Gait   Ambulation Distance (Feet) 90 Feet    Assistive device Straight cane    Gait Pattern Step-through pattern;Step-to pattern;Decreased stance time - right;Decreased step length - left;Trunk flexed;Poor foot clearance - right;Poor foot clearance -  left    Gait Comments switched SPC hand to R d/t him reporting his L leg wants to go sometimes but denies this on the R      Exercises   Exercises Ankle      Ankle Exercises: Stretches   Gastroc Stretch 1 rep;30 seconds    Gastroc Stretch Limitations runner's stretch, B      Ankle Exercises: Aerobic   Nustep L3x64min      Ankle Exercises: Seated   Other Seated Ankle Exercises ankle resisted DF with yellow TB 10 reps                 Balance Exercises - 10/06/20 0001       Balance Exercises: Standing   Standing Eyes Closed Narrow base of support (BOS);Foam/compliant surface;3 reps;10 secs   10 reps with foward reaching; most swaying to the L and posteriorly   Tandem Gait Forward;Retro;Upper extremity support;3 reps   semi tandem walk along the counter fwd and back 3x; hesitancy going backward   Partial Tandem Stance Eyes open;Upper extremity support 1;2 reps;30 secs    Sidestepping Upper extremity support;2 reps;Theraband    Theraband Level (Sidestepping) Level 1 (Yellow)    Other Standing Exercises toe taps 12 reps onto 6' step; cues for eccentric control  PT Short Term Goals - 10/03/20 1606       PT SHORT TERM GOAL #1   Title Patient to be independent with initial HEP.    Time 3    Period Weeks    Status Achieved    Target Date 10/12/20               PT Long Term Goals - 10/03/20 1606       PT LONG TERM GOAL #1   Title Patient to be independent with advanced HEP.    Time 6    Period Weeks    Status On-going      PT LONG TERM GOAL #2   Title Patient to score <14 sec on TUG testing with LRAD in order to decrease risk of falls.    Time 6    Period Weeks    Status On-going      PT LONG TERM GOAL #3   Title Patient to demonstrate 5xSTS in <12 sec in order to decrease risk of falls.    Time 6    Period Weeks    Status On-going      PT LONG TERM GOAL #4   Title Patient to report 0/10 dizziness with standing VOR vertical and  horizontal.    Time 6    Period Weeks    Status On-going      PT LONG TERM GOAL #5   Title Patient to score atleast 20/24 on DGI in order to decrease risk of falls.    Time 6    Period Weeks    Status Achieved   10/03/20 22/24                  Plan - 10/06/20 1659     Clinical Impression Statement Pt reports that his dizziness is gone but balance continues to be an issue. He still shows increased sway, mostly posterior and L sided, with narrow stance activities. Pt also shows decreased foot clearance with toe taps and increased instability with stance on the R LE. VCs required for adequate WS through both LEs and min assist for stability. Also switched gait with SPC to the R UE d/t his complaint of the "L leg wanting to go out sometimes". He would continue to benefit from dynamic balance exercises along with strengthening for the proximal LEs.    Personal Factors and Comorbidities Age;Comorbidity 3+;Fitness;Past/Current Experience;Time since onset of injury/illness/exacerbation    Comorbidities renal disorder, HTN, DM, kidney transplant    PT Frequency 2x / week    PT Duration 6 weeks    PT Treatment/Interventions ADLs/Self Care Home Management;Canalith Repostioning;Cryotherapy;Moist Heat;Balance training;Therapeutic exercise;Therapeutic activities;Functional mobility training;Stair training;Gait training;DME Instruction;Neuromuscular re-education;Patient/family education;Manual techniques;Vestibular;Taping;Energy conservation;Dry needling;Passive range of motion    PT Next Visit Plan higher level balance training, LE strengthening, calf stretching    Consulted and Agree with Plan of Care Patient             Patient will benefit from skilled therapeutic intervention in order to improve the following deficits and impairments:  Abnormal gait, Decreased activity tolerance, Decreased strength, Increased fascial restricitons, Decreased balance, Difficulty walking, Improper body  mechanics, Dizziness, Impaired flexibility, Postural dysfunction  Visit Diagnosis: Dizziness and giddiness  Unsteadiness on feet     Problem List Patient Active Problem List   Diagnosis Date Noted   Dizziness 09/13/2020   Vomiting 09/13/2020   Diarrhea 09/13/2020   Abnormal EKG 09/13/2020   Bradycardia 09/13/2020    Artist Pais, PTA  10/06/2020, 5:56 PM  [facility] [address]  [place], [place], [address] Phone: [phone]   Fax:  [phone]  Name: Bryan Rios MRN: [id number] Date of Birth: Sep 02, 1950   PHYSICAL THERAPY DISCHARGE SUMMARY  Visits from Start of Care: 3  Current functional level related to goals / functional outcomes: Unable to assess; patient did not return    Remaining deficits: Unable to assess   Education / Equipment: HEP  Plan: Patient agrees to discharge.  Patient goals were not met. Patient is being discharged due to not returning.     Janene Harvey, PT, DPT 11/16/20 3:57 PM

## 2020-10-11 ENCOUNTER — Ambulatory Visit: Payer: HMO

## 2020-10-13 ENCOUNTER — Encounter: Payer: HMO | Admitting: Physical Therapy

## 2020-10-18 ENCOUNTER — Encounter: Payer: HMO | Admitting: Physical Therapy

## 2020-10-20 ENCOUNTER — Encounter: Payer: HMO | Admitting: Physical Therapy

## 2020-12-20 ENCOUNTER — Inpatient Hospital Stay: Payer: HMO

## 2020-12-20 ENCOUNTER — Ambulatory Visit: Payer: HMO | Admitting: Cardiology

## 2020-12-20 ENCOUNTER — Encounter: Payer: Self-pay | Admitting: Cardiology

## 2020-12-20 ENCOUNTER — Other Ambulatory Visit: Payer: Self-pay

## 2020-12-20 VITALS — BP 154/84 | HR 51 | Temp 97.6°F | Resp 16 | Ht 71.0 in | Wt 190.6 lb

## 2020-12-20 DIAGNOSIS — R001 Bradycardia, unspecified: Secondary | ICD-10-CM

## 2020-12-20 DIAGNOSIS — I739 Peripheral vascular disease, unspecified: Secondary | ICD-10-CM | POA: Insufficient documentation

## 2020-12-20 NOTE — Progress Notes (Signed)
Patient referred by Bonsu, Osei A, DO for bradycardia  Subjective:   Bryan Rios, male    DOB: 1950/05/16, 70 y.o.   MRN: [id number]   Chief Complaint  Patient presents with   Bradycardia   New Patient (Initial Visit)     HPI  70 y.o. African-American male with hypertension, type 2 diabetes mellitus, CKD stage 3b, h/o renal transplant 2010 referred for evaluation of bradycardia  Patient is retired Buyer, retail.  Currently, he is very sedentary, other than occasional walking and playing basketball.  Patient was recently noted to have slower than usual heart rate on his nephrology and PCP visits, therefore referred to me.  He denies any chest pain, shortness of breath, presyncope, syncope symptoms.   On a separate note, patient reports claudication symptoms in his right calf, as well as numbness in his right foot.  He does not have any rest pain or nonhealing wounds or ulcers.  Patient was recently hospitalized in 08/2020 with complaints of nausea, vomiting, dizziness, diarrhea.  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  Given his subtle EKG abnormalities, he underwent Lexiscan stress test which was negative for ischemia.  Patient attributed his symptoms to being on statin, therefore has not been taking statin since then.  Fortunately, lipid panel is reasonably good.   Past Medical History:  Diagnosis Date   Diabetes mellitus without complication ([place])    Hypertension    Renal disorder      Past Surgical History:  Procedure Laterality Date   KIDNEY TRANSPLANT       Social History   Tobacco Use  Smoking Status Former  Smokeless Tobacco Never    Social History   Substance and Sexual Activity  Alcohol Use Not Currently     Family History  Problem Relation Age of Onset   Diabetes Mellitus I Mother    Diabetes Mellitus I Father      Current Outpatient Medications on File  Prior to Visit  Medication Sig Dispense Refill   acetaminophen (TYLENOL) 500 MG tablet Take 500-1,000 mg by mouth 2 (two) times daily as needed for mild pain or headache.     aspirin EC 81 MG tablet Take 81 mg by mouth daily.     atorvastatin (LIPITOR) 40 MG tablet Take 40 mg by mouth at bedtime.     cyclobenzaprine (FLEXERIL) 10 MG tablet Take 1 tablet (10 mg total) by mouth 2 (two) times daily as needed for muscle spasms. (Patient not taking: No sig reported) 20 tablet 0   ergocalciferol (VITAMIN D2) 1.25 MG (50000 UT) capsule Take 50,000 Units by mouth every Sunday.     insulin aspart (NOVOLOG) 100 UNIT/ML injection Inject 0-5 Units into the skin See admin instructions. Per sliding scale     insulin glargine (LANTUS) 100 UNIT/ML injection Inject 15 Units into the skin 2 (two) times daily.     irbesartan (AVAPRO) 300 MG tablet Take 150 mg by mouth in the morning and at bedtime.     mycophenolate (MYFORTIC) 180 MG EC tablet Take 360 mg by mouth 2 (two) times daily.     tacrolimus (PROGRAF) 0.5 MG capsule Take 1-1.5 mg by mouth See admin instructions. 1.5 mg in the morning and 1 mg at bedtime     No current facility-administered medications on file prior to visit.    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   Right bundle branch block, left intrafascicular block.   Left atrial enlargement.   Poor R wave progression.  Recent labs: 09/14/2020: Glucose 84, BUN/Cr 24/1.79. EGFR 41. Na/K 139/4.2.  H/H 13/41. MCV 83. Platelets 187 HbA1C 7.4% Chol 111, TG 59, HDL 41, LDL 58 TSH 1.6 normal    Review of Systems  Cardiovascular:  Positive for claudication. Negative for chest pain, dyspnea on exertion, leg swelling, palpitations and syncope.        Vitals:   12/20/20 1007  BP: (!) 154/84  Pulse: (!) 51  Resp: 16  Temp: 97.6 F (36.4 C)   SpO2: 98%     Body mass index is 26.58 kg/m. Filed Weights   12/20/20 1007  Weight: 190 lb 9.6 oz (86.5 kg)     Objective:   Physical Exam Vitals and nursing note reviewed.  Constitutional:      General: He is not in acute distress. Neck:     Vascular: No JVD.  Cardiovascular:     Rate and Rhythm: Regular rhythm. Bradycardia present.     Pulses:          Femoral pulses are 3+ on the right side and 3+ on the left side.      Popliteal pulses are 0 on the right side and 1+ on the left side.       Dorsalis pedis pulses are 0 on the right side and 0 on the left side.       Posterior tibial pulses are 0 on the right side and 1+ on the left side.     Heart sounds: Murmur heard.  Harsh midsystolic murmur is present with a grade of 1/6 at the upper right sternal border radiating to the neck.  Pulmonary:     Effort: Pulmonary effort is normal.     Breath sounds: Normal breath sounds. No wheezing or rales.  Musculoskeletal:     Right lower leg: No edema.     Left lower leg: No edema.           Assessment & Recommendations:    70 y.o. African-American male with hypertension, type 2 diabetes mellitus, CKD stage 3b, h/o renal transplant 2010 referred for evaluation of bradycardia  Bradycardia: EKG today shows sinus rhythm with IVCD.  Subtle variation from EKG dated 12/19/2020 that showed RBBB, LAFB is likely due to differential lead placement.  Otherwise, patient is asymptomatic from bradycardia standpoint.  Nonetheless, recommend 1 week cardiac telemetry to rule out any significant sinus node dysfunction, will also obtain echocardiogram for out any structural abnormality.  Claudication: Rutherford class II.  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  Will obtain lower extremity duplex. Continue aspirin 81 mg daily. Continue aggressive management of diabetes and hypertension.  Defer to PCP. He is currently not on statin due to previous side effects.  Lipid panel is reasonable.  After lower  extremity duplex testing, will consider alternate lipid-lowering management.  Further recommendations after above testing.   Thank you for referring the patient to [place]. Please feel free to contact with any questions.   Nigel Mormon, MD Pager: [phone] Office: [phone]

## 2021-01-12 ENCOUNTER — Ambulatory Visit: Payer: HMO

## 2021-01-12 ENCOUNTER — Other Ambulatory Visit: Payer: Self-pay

## 2021-01-12 DIAGNOSIS — I739 Peripheral vascular disease, unspecified: Secondary | ICD-10-CM

## 2021-01-12 DIAGNOSIS — R001 Bradycardia, unspecified: Secondary | ICD-10-CM

## 2021-01-16 ENCOUNTER — Ambulatory Visit: Payer: HMO | Admitting: Cardiology

## 2021-01-16 ENCOUNTER — Other Ambulatory Visit: Payer: Self-pay

## 2021-01-16 ENCOUNTER — Encounter: Payer: Self-pay | Admitting: Cardiology

## 2021-01-16 VITALS — BP 157/89 | HR 50 | Temp 98.0°F | Resp 16 | Ht 71.0 in | Wt 191.0 lb

## 2021-01-16 DIAGNOSIS — I739 Peripheral vascular disease, unspecified: Secondary | ICD-10-CM

## 2021-01-16 DIAGNOSIS — R001 Bradycardia, unspecified: Secondary | ICD-10-CM

## 2021-01-16 MED ORDER — EZETIMIBE 10 MG PO TABS
10.0000 mg | ORAL_TABLET | Freq: Every day | ORAL | 3 refills | Status: AC
Start: 2021-01-16 — End: 2021-04-16

## 2021-01-16 NOTE — Patient Instructions (Signed)
Endovascular Therapy for Peripheral Vascular Disease, Care After The following information offers guidance on how to care for yourself after your procedure. Your health care provider may also give you more specific instructions. If you have problems or questions, contact your health care provider. What can I expect after the procedure? After the procedure, it is common to have: Pain. Soreness and bruising around your puncture or incision (access site). Fatigue. Follow these instructions at home: Access site care  Follow instructions from your health care provider about how to take care of your access site. Make sure you: Wash your hands with soap and water for at least 20 seconds before and after you change your bandage (dressing). If soap and water are not available, use hand sanitizer. Change your dressing as told by your health care provider. Leave stitches (sutures), skin glue, or adhesive strips in place. If adhesive strip edges start to loosen and curl up, you may trim the loose edges. Do not remove adhesive strips or skin glue completely unless your health care provider tells you to do that. Check your access site every day for signs of infection. Check for: More redness, swelling, or pain. A lump or bump. Fluid or blood. Warmth. Pus or a bad smell. Medicines Take over-the-counter and prescription medicines only as told by your health care provider. You may need to take medicines to prevent blood clots and to lower your cholesterol. If you were prescribed an antibiotic medicine, take it as told by your health care provider. Do not stop taking the antibiotic even if you start to feel better. Driving Do not drive until your health care provider approves. If you were given a sedative during the procedure, it can affect you for several hours. Do not drive or operate machinery until your health care provider says that it is safe. Ask your health care provider if the medicine prescribed to  you requires you to avoid driving or using machinery. Activity Rest as told by your health care provider. Avoid sitting for a long time without moving. Get up to take short walks every 1-2 hours. This is important to improve blood flow and breathing. Ask for help if you feel weak or unsteady. Do not lift anything that is heavier than 10 lb (4.5 kg), or the limit that you are told, until your health care provider says that it is safe. Avoid activity that requires a lot of effort, such as exercise and sports, as told by your health care provider. Avoid sexual activity until your health care provider says it is safe. Follow your exercise plan as told by your health care provider. Return to your normal activities as told by your health care provider. Ask your health care provider what activities are safe for you. Eating and drinking Drink fluids as instructed to help flush out the dye used during the procedure. Follow instructions from your health care provider about eating or drinking restrictions. You may need to eat a diet that is low in salt (sodium) and fat. Avoid drinking alcohol. General instructions Do not take baths, swim, or use a hot tub until your health care provider approves. Ask your health care provider if you may take showers. You may only be allowed to take sponge baths. Do not use any products that contain nicotine or tobacco. These products include cigarettes, chewing tobacco, and vaping devices, such as e-cigarettes. If you need help quitting, ask your health care provider. Keep all follow-up visits. This is important. Contact a health care  provider if: You have a fever. You have severe pain that does not get better with medicine. You have more redness, swelling, or pain around your access site. You have a lump or bump at your access site. Get help right away if:  You have fluid or blood coming from your access site. If this happens, lie down on your back and apply pressure  to the area. You have chest pain. You have problems breathing. You have pain, numbness, or tingling in your legs. You faint. You have any symptoms of a stroke. "BE FAST" is an easy way to remember the main warning signs of a stroke: B - Balance. Signs are dizziness, sudden trouble walking, or loss of balance. E - Eyes. Signs are trouble seeing or a sudden change in vision. F - Face. Signs are sudden weakness or numbness of the face, or the face or eyelid drooping on one side. A - Arms. Signs are weakness or numbness in an arm. This happens suddenly and usually on one side of the body. S - Speech. Signs are sudden trouble speaking, slurred speech, or trouble understanding what people say. T - Time. Time to call emergency services. Write down what time symptoms started. You have other signs of a stroke, such as: A sudden, severe headache with no known cause. Nausea or vomiting. Seizure. These symptoms may represent a serious problem that is an emergency. Do not wait to see if the symptoms will go away. Get medical help right away. Call your local emergency services (911 in the [place] Do not drive yourself to the hospital. Summary After the procedure, it is common to have pain and soreness near your puncture or incision (access site). Check your access site every day for signs of infection, such as redness, swelling, or pain. You may need to take medicines to prevent blood clots and to lower your cholesterol. If you have any signs of a stroke, get help right away. This information is not intended to replace advice given to you by your health care provider. Make sure you discuss any questions you have with your health care provider. Document Revised: 10/19/2019 Document Reviewed: 10/19/2019 Elsevier Patient Education  2022 [place]. Intermittent Claudication Intermittent claudication is pain in one leg or both legs that occurs when walking or exercising and goes away when resting. This  condition is a symptom of peripheral vascular disease (PVD). PVD is a disease of the blood vessels. This condition is commonly treated with physical activity, medicine, and lifestyle changes. If medical management does not improve symptoms, surgery may be done to restore blood flow. This surgery is called revascularization. What are the causes? This condition is caused by a buildup of fatty material and other substances (plaque) within the arteries (atherosclerosis). Plaque makes arteries stiff and narrow, preventing proper blood flow to the leg muscles. Pain occurs when you walk or exercise because your muscles need more blood when you are moving and exercising but cannot get it because of poor blood flow. What increases the risk? The following factors may make you more likely to develop this condition: Smoking cigarettes. A personal history of stroke or heart disease. Age. The older you are, the higher the risk. Being inactive (sedentary lifestyle) or being overweight. A family history of atherosclerosis. Having another health condition, such as: Diabetes. High blood pressure. High cholesterol. What are the signs or symptoms? Symptoms of this condition can be in one leg or both legs, and can occur in the feet, calf, thigh,  hip, or buttock over time. Symptoms may include: Aches or pains when walking. Cramps. A feeling of tightness, weakness, or heaviness. A wound on the lower leg or foot that heals poorly or does not heal. How is this diagnosed? This condition may be diagnosed based on: Your symptoms. Your medical history. A physical exam. Tests, such as: Ankle-brachial index (ABI) to check blood pressure in the legs and compare it to the pressure in the arms. Exercise ankle-brachial test. For this test, you walk on a treadmill. Tests are done to check how the condition affects your ability to walk or exercise. Arterial duplex ultrasound to view how blood flows within arteries. CT  angiogram (CTA). An X-ray machine is used to take pictures of the blood vessels after dye is injected. Magnetic resonance angiogram (MRA). This creates images of blood vessels and blood flow within them. Angiogram. In this procedure, dye is injected into arteries and then X-rays are taken. Blood tests. How is this treated? Treatment for this condition involves treating the underlying cause and managing risk factors, such as high blood pressure, high cholesterol, or diabetes. Treatment may include: Lifestyle changes, such as: Starting a supervised or home-based exercise program. Losing weight. Quitting smoking. Medicines to help restore blood flow through your legs. If you have symptoms that affect your everyday activities, or have a wound that is not healing, treatment may include: Angioplasty to open a blocked artery using an inflated balloon. Stent implant to open a blocked artery using a mesh-like tube. Surgery to restore blood flow by creating a bypass around a blocked area. Follow these instructions at home: Lifestyle  Maintain a healthy weight. Eat a diet that is low in saturated fats and calories. Consider working with a dietitianto help you make healthy food choices. Do not use any products that contain nicotine or tobacco. These products include cigarettes, chewing tobacco, and vaping devices, such as e-cigarettes. If you need help quitting, ask your health care provider. If your health care provider recommended an exercise program for you, follow it as directed. Your exercise program may involve: Walking three or more times a week. Walking until you have certain symptoms of intermittent claudication, resting until your symptoms go away, and then resuming your walk. Gradually increasing your total walking time to about 50 minutes a day. General instructions Work with your health care provider to manage other health conditions that may increase your risk, including diabetes, high  blood pressure, or high cholesterol. Take over-the-counter and prescription medicines only as told by your health care provider. Keep all follow-up visits. This is important. Contact a health care provider if: Your pain does not go away with rest. You have sores on your legs that do not heal or have pus or a bad smell. Your condition gets worse or does not improve with treatment. Get help right away if: You have chest pain. You have trouble breathing. Your foot or leg is cold or it changes color. Your foot or leg becomes numb. You have any symptoms of a stroke. "BE FAST" is an easy way to remember the main warning signs of a stroke: B - Balance. Signs are dizziness, sudden trouble walking, or loss of balance. E - Eyes. Signs are trouble seeing or a sudden change in vision. F - Face. Signs are sudden weakness or numbness of the face, or the face or eyelid drooping on one side. A - Arms. Signs are weakness or numbness in an arm. This happens suddenly and usually on one side  of the body. S - Speech. Signs are sudden trouble speaking, slurred speech, or trouble understanding what people say. T - Time. Time to call emergency services. Write down what time symptoms started. You have other signs of a stroke, such as: A sudden, severe headache with no known cause. Nausea or vomiting. Seizure. These symptoms may represent a serious problem that is an emergency. Do not wait to see if the symptoms resolve. Get medical help right away. Call your local emergency services (911 in the [place]). Do not drive yourself to the hospital.  Summary Intermittent claudication is pain in the leg or legs that occurs when walking or exercising and goes away when resting. This condition is caused by a buildup of plaque in the arteries. Plaque makes arteries stiff and narrow, which prevents proper blood flow to the leg. Intermittent claudication can be treated with medicine and lifestyle changes. If these fail, surgery may  be done to restore blood flow to the affected area. Work with your health care provider to manage other health conditions you may have, including diabetes, high blood pressure, or high cholesterol. This information is not intended to replace advice given to you by your health care provider. Make sure you discuss any questions you have with your health care provider. Document Revised: 10/19/2019 Document Reviewed: 10/19/2019 Elsevier Patient Education  [place].

## 2021-01-16 NOTE — Progress Notes (Signed)
Patient referred by Bonsu, Osei A, DO for bradycardia  Subjective:   Bryan Rios, male    DOB: 07-Aug-1950, 70 y.o.   MRN: [id number]   Chief Complaint  Patient presents with   Bradycardia   Follow-up    4 week     HPI  70 y.o. African-American male with hypertension, type 2 diabetes mellitus, CKD stage 3b, h/o renal transplant 2010 referred for evaluation of bradycardia  Patient is retired Buyer, retail.  Currently, he is very sedentary, other than occasional walking and playing basketball.  Patient was recently noted to have slower than usual heart rate on his nephrology and PCP visits, therefore referred to me.  He denies any chest pain, shortness of breath, presyncope, syncope symptoms.   On a separate note, patient reports claudication symptoms in his right calf, as well as numbness in his right foot.  He does not have any rest pain or nonhealing wounds or ulcers.  Patient was recently hospitalized in 08/2020 with complaints of nausea, vomiting, dizziness, diarrhea.  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  Given his subtle EKG abnormalities, he underwent Lexiscan stress test which was negative for ischemia.  Patient attributed his symptoms to being on statin, therefore has not been taking statin since then.  Fortunately, lipid panel is reasonably good.  Reviewed recent cardiac testing with the patient, details below.   Current Outpatient Medications on File Prior to Visit  Medication Sig Dispense Refill   acetaminophen (TYLENOL) 500 MG tablet Take 500-1,000 mg by mouth 2 (two) times daily as needed for mild pain or headache.     aspirin EC 81 MG tablet Take 81 mg by mouth daily.     ergocalciferol (VITAMIN D2) 1.25 MG (50000 UT) capsule Take 50,000 Units by mouth every Sunday.     insulin aspart (NOVOLOG) 100 UNIT/ML injection Inject 0-5 Units into the skin See admin instructions. Per  sliding scale     insulin glargine (LANTUS) 100 UNIT/ML injection Inject 15 Units into the skin 2 (two) times daily.     irbesartan (AVAPRO) 150 MG tablet Take 150 mg by mouth daily.     mycophenolate (MYFORTIC) 180 MG EC tablet Take 360 mg by mouth 2 (two) times daily.     tacrolimus (PROGRAF) 0.5 MG capsule Take 1-1.5 mg by mouth See admin instructions. 1.5 mg in the morning and 1 mg at bedtime     zinc gluconate 50 MG tablet Take 50 mg by mouth daily.     simvastatin (ZOCOR) 40 MG tablet Take 40 mg by mouth at bedtime. (Patient not taking: Reported on 01/16/2021)     No current facility-administered medications on file prior to visit.    Cardiovascular and other pertinent studies:  EKG 01/16/2021: Sinus bradycardia 48 bpm Right bundle branch block Left anterior fascicular block  Lower Extremity Arterial Duplex 01/12/2021:  monophasic waveforms throughout the right lower extremity suggest proximal  vessel disease, iliac artery or common femoral artery hemodynamically  significant disease. there is >50% stenosis in the right mid popliteal  artery probably >70-99% stenosis.  There is <50% stenosis in the left distal SFA.  This exam reveals moderately decreased perfusion of the right lower  extremity, noted at the post tibial artery level (ABI 0.69) with markedly  abnormal monophasic waveform.    This exam reveals moderately decreased perfusion of the left lower  extremity, noted at the post tibial artery level (ABI 0.63) with mildly  abnormal biphasic waveform at the left ankle.   Mobile cardiac telemetry 6 days 12/20/2020 - 01/01/2021: Dominant rhythm: Sinus. HR 42-174 bpm. Avg HR 54 bpm. 5 episodes of SVT, fastest at 174 bpm for 5 beats, longest for 9 beats at 105 bpm. 2.4% isolated SVE, <1% couplet/triplets. 0 episodes of VT <1% isolated VE, couplet/triplets. No atrial fibrillation/atrial flutter/VT/high grade AV block, sinus pause >3sec noted. 1 patient triggered events,  correlated with artifact.  Recent labs: 09/14/2020: Glucose 84, BUN/Cr 24/1.79. EGFR 41. Na/K 139/4.2.  H/H 13/41. MCV 83. Platelets 187 HbA1C 7.4% Chol 111, TG 59, HDL 41, LDL 58 TSH 1.6 normal    Review of Systems  Cardiovascular:  Positive for claudication. Negative for chest pain, dyspnea on exertion, leg swelling, palpitations and syncope.        Vitals:   01/16/21 1037  BP: (!) 157/89  Pulse: (!) 50  Resp: 16  Temp: 98 F (36.7 C)  SpO2: 98%     Body mass index is 26.64 kg/m. Filed Weights   01/16/21 1037  Weight: 191 lb (86.6 kg)     Objective:   Physical Exam Vitals and nursing note reviewed.  Constitutional:      General: He is not in acute distress. Neck:     Vascular: No JVD.  Cardiovascular:     Rate and Rhythm: Regular rhythm. Bradycardia present.     Pulses:          Femoral pulses are 3+ on the right side and 3+ on the left side.      Popliteal pulses are 0 on the right side and 1+ on the left side.       Dorsalis pedis pulses are 0 on the right side and 0 on the left side.       Posterior tibial pulses are 0 on the right side and 1+ on the left side.     Heart sounds: Murmur heard.  Harsh midsystolic murmur is present with a grade of 1/6 at the upper right sternal border radiating to the neck.  Pulmonary:     Effort: Pulmonary effort is normal.     Breath sounds: Normal breath sounds. No wheezing or rales.  Musculoskeletal:     Right lower leg: No edema.     Left lower leg: No edema.           Assessment & Recommendations:    70 y.o. African-American male with hypertension, type 2 diabetes mellitus, CKD stage 3b, h/o renal transplant 2010 referred for evaluation of bradycardia  Bradycardia: No significant bradyarthria on monitor.  Hypertension: He wants to discuss with nephrology re: increasing irbesartan dose to 300 mg.  PAD Rutherford class III.  B/l LE PAD on duplex. Continue aspirin 81 mg daily. Recommend regular  walking. Unable to use Pletal given ongoing use of tacrolimus. H/o statin intolerance. Recommend Zetia 10 mg.   F/u in 6-8 weeks. If no symptoms improvement, could consider revascularization. Will need to CO2.  Further recommendations after above testing.   Thank you for referring the patient to [place]. Please feel free to contact with any questions.   Nigel Mormon, MD Pager: [phone] Office: [phone]

## 2021-03-06 ENCOUNTER — Ambulatory Visit: Payer: Self-pay | Admitting: Cardiology

## 2021-03-06 DIAGNOSIS — I10 Essential (primary) hypertension: Secondary | ICD-10-CM | POA: Insufficient documentation

## 2021-03-06 NOTE — Progress Notes (Signed)
No show

## 2021-07-05 LAB — CMP (EXT)
A/G Ratio (EXT): 1.5 (calc) (ref 1.0–2.5)
ALT/SGPT (EXT): 12 U/L (ref 9–46)
AST/SGOT (EXT): 15 U/L (ref 10–35)
Albumin (EXT): 4.6 g/dL (ref 3.6–5.1)
Alkaline Phosphatase (EXT): 57 U/L (ref 35–144)
BUN (EXT): 26 mg/dL — ABNORMAL HIGH (ref 7–25)
BUN/CREAT Ratio (EXT): 15 (calc) (ref 6–22)
Bilirubin, Total (EXT): 0.4 mg/dL (ref 0.2–1.2)
CO2 (EXT): 29 mmol/L (ref 20–32)
CalciumCalcium (EXT): 9.9 mg/dL (ref 8.6–10.3)
Chloride (EXT): 106 mmol/L (ref 98–110)
Creatinine (EXT): 1.74 mg/dL — ABNORMAL HIGH (ref 0.70–1.35)
Globulin (EXT): 3 g/dL (ref 1.9–3.7)
Glucose (EXT): 116 mg/dL — ABNORMAL HIGH (ref 65–99)
Potassium (EXT): 5.1 mmol/L (ref 3.5–5.3)
Protein (EXT): 7.6 g/dL (ref 6.1–8.1)
Sodium (EXT): 140 mmol/L (ref 135–146)
eGFR - Creat CKD-EPI (EXT): 42 mL/min/{1.73_m2} — ABNORMAL LOW (ref >=60)

## 2021-07-05 LAB — UNMAPPED LAB RESULTS
Chol/HDL Ratio (EXT): 3.9 (calc) (ref <5.0)
Cholesterol (EXT): 192 mg/dL (ref <200)
HDL Cholesterol (EXT): 49 mg/dL (ref >=40)
LDL Cholesterol, CALC (EXT): 124 mg/dL — ABNORMAL HIGH
NON HDL Cholesterol (EXT): 143 mg/dL — ABNORMAL HIGH (ref <130)
Triglycerides (EXT): 91 mg/dL (ref <150)

## 2021-07-05 LAB — HEMOGLOBIN A1C: HEMOGLOBIN A1C % (INT/EXT): 6.8 %{Hb} — ABNORMAL HIGH (ref <5.7)

## 2021-09-05 ENCOUNTER — Inpatient Hospital Stay: Admit: 2021-09-05 | Payer: MEDICARE | Primary: Registered Nurse

## 2021-09-05 DIAGNOSIS — I739 Peripheral vascular disease, unspecified: Secondary | ICD-10-CM

## 2021-11-29 ENCOUNTER — Ambulatory Visit: Payer: MEDICARE | Primary: Registered Nurse

## 2021-11-29 LAB — UNMAPPED LAB RESULTS: Creatinine Urine (EXT): 1180 mg/L

## 2021-11-29 LAB — HEMOGLOBIN A1C: HEMOGLOBIN A1C % (INT/EXT): 6.7 % — ABNORMAL HIGH (ref 4.0–6.0)

## 2021-12-06 ENCOUNTER — Inpatient Hospital Stay: Admit: 2021-12-06 | Payer: MEDICARE | Primary: Registered Nurse

## 2021-12-06 DIAGNOSIS — I739 Peripheral vascular disease, unspecified: Secondary | ICD-10-CM

## 2021-12-06 NOTE — Consults (Signed)
Interventional Radiology Outpatient Consultation Visit    Date / Time: 12/06/2021, 8:42 AM  Patient Name: Max Murphy  MRN: [id number]    REASON FOR CONSULT: Claudication  REFERRING PHYSICIAN:  Marita Snellen, [place]   PRIMARY CARE PHYSICIAN:  Merrilee Jansky, NP (Phone [phone])    Dear Dr. Alyce Pagan, ?   ?  I had the pleasure of seeing Max Murphy in the Interventional Radiology clinic.  Below is our evaluation and recommendations.  Thank you for referring this pleasant patient to me.    CC: Right calf claudication    HPI:  Max Murphy is a 71 y.o. male with multiple risk factors for PAD who presents with 1 year history of worsening right calf claudication, now occurring at approximately 6 blocks.  Has baseline foot neuropathy for 15 years, right greater than left.  Denies rest pain.  No ulcer.  Not as active as he like to be.  Feels pain limits his ability to be active.  No prior vascular interventions.    ROS:  Occasional neck pain.  Otherwise, 12 point review of systems negative outside of history of present illness.    PMH:    Hypertension, hypercholesterolemia, insulin-dependent diabetes mellitus, renal failure, status post successful renal transplant in 2010.    PSH:  Renal transplant 2010, ED surgery    MEDS:   Home Medications:  Getting a list    Allergies:  Lisinopril, amlodipine, nifedipine, terbinafine, Cardizem, metformin    FH:  Mother and father as well as brother both had diabetes.  No amputations.    SH:  1 pack/day tobacco use x 20 years.  Quit 35 years ago, quit alcohol 35 years ago.  No exercise program.  Retired Programme researcher, broadcasting/film/video.    Physical Exam:  Vital Signs: BP 154/88, heart rate 52, respirations 17, room air saturation 98%, height 5 foot 11 inches, weight 190 pounds  General:  Pleasant, comfortable, NAD   Skin:  Normal, dry  HEENT:  Normocephalic, anicteric  Neck:  Supple, no LAD, no bruit  Back:  Normal range of motion, no tenderness  Cardiovascular:  Regular rate and  rhythm, S1/S2  Pulmonary:  Clear to auscultation bilaterally  Abdomen:  Soft, non-tender, no bruit  Lower extremities:    Right leg: 2+ femoral pulse, 1+ popliteal pulse, no palpable DP pulse, biphasic DP signal, normal motor and sensation, normal capillary refill, no edema, no varicosities, onychomycosis.  Leg leg: 2+ femoral pulse, 2+ popliteal pulse, 2+ DP pulse, trace palpable PT pulse, triphasic signals, normal motor and sensation, normal capillary refill, no edema, no varicosities, onychomycosis  Neuro:  Awake, alert, appropriate  Psych:  Calm, appropriate mood and affect  Musculoskeletal:  Climbed on/off exam table and ambulated without difficulty or assistance    Imaging:  I reviewed PVR study from Sep 05, 2021 which revealed ABI of 0.93 on the right at rest, 0.87 on exercise.  On the left, 0.98 at rest and 0.92 on exercise.  The patient walked 1.5 mph on 5% incline with no complaints.  Waveform analysis suggestive of mild inflow and outflow disease on the right.    Assessment:    71 year old male with multiple risk factors for PAD and physical examination/vascular noninvasive studies are consistent with mild right lower extremity inflow and outflow disease.  Moderate claudication category.    Fontaine stage:  IIb - Moderate to severe claudication  Rutherford grade-category:  I-2 Moderate claudication  Wagner diabetic foot ulcer grade:  0 - No ulcer  in high risk foot    Plan / Recommendations:    . Basic PAD education provided, including reading materials.  Importance of continued risk factor modification, vigilant treatment of hypertension, hypercholesterolemia and diabetes stressed.  . Instructed patient to begin formal exercise therapy, 3 hours of exercise a week, walking to the point of claudication.  Should this be ineffective, may begin Pletal therapy.  No indication for additional imaging or intervention at this time.  . Follow-up with me in the PAD clinic in 6 months.      Dear Dr. Alyce Pagan, it was a  pleasure meeting your patient today.  Please do not hesitate to call for any questions or concerns.  We will continue to update you with our progress.  Thank you for allowing [place] to participate in the care of this patient.    A total of 42 minutes was spent on clinical care of the patient on the day of the encounter, including chart review, meeting with the patient/family, documentation and coordinating care.    _______________________________________________  Mayra Reel, MD  Vascular & Interventional Radiology  [facility]

## 2022-02-28 LAB — UNMAPPED LAB RESULTS: Creatinine Urine (EXT): 1012 mg/L

## 2022-05-09 NOTE — Telephone Encounter (Signed)
Spoke w Gwyndolyn Saxon gave him follow up appt information with AH. He confirmed.

## 2022-05-30 ENCOUNTER — Ambulatory Visit: Payer: MEDICARE | Attending: Diagnostic Radiology | Primary: Registered Nurse
